# Patient Record
Sex: Female | Born: 1950 | Race: Black or African American | Hispanic: No | Marital: Single | Smoking: Current every day smoker
Health system: Southern US, Community
[De-identification: ages and names within clinical notes are randomized; demographics above are authoritative.]

## PROBLEM LIST (undated history)

## (undated) DIAGNOSIS — J449 Chronic obstructive pulmonary disease, unspecified: Secondary | ICD-10-CM

## (undated) DIAGNOSIS — G4730 Sleep apnea, unspecified: Secondary | ICD-10-CM

## (undated) DIAGNOSIS — E079 Disorder of thyroid, unspecified: Secondary | ICD-10-CM

## (undated) DIAGNOSIS — T7840XA Allergy, unspecified, initial encounter: Secondary | ICD-10-CM

## (undated) DIAGNOSIS — K219 Gastro-esophageal reflux disease without esophagitis: Secondary | ICD-10-CM

## (undated) DIAGNOSIS — K469 Unspecified abdominal hernia without obstruction or gangrene: Secondary | ICD-10-CM

## (undated) DIAGNOSIS — I10 Essential (primary) hypertension: Secondary | ICD-10-CM

## (undated) DIAGNOSIS — IMO0002 Reserved for concepts with insufficient information to code with codable children: Secondary | ICD-10-CM

## (undated) DIAGNOSIS — M329 Systemic lupus erythematosus, unspecified: Secondary | ICD-10-CM

## (undated) HISTORY — DX: Allergy, unspecified, initial encounter: T78.40XA

## (undated) HISTORY — DX: Essential (primary) hypertension: I10

## (undated) HISTORY — DX: Sleep apnea, unspecified: G47.30

## (undated) HISTORY — PX: TUBAL LIGATION: SHX77

## (undated) HISTORY — PX: CHOLECYSTECTOMY: SHX55

## (undated) HISTORY — PX: ABDOMINAL HYSTERECTOMY: SHX81

## (undated) HISTORY — DX: Gastro-esophageal reflux disease without esophagitis: K21.9

## (undated) HISTORY — DX: Chronic obstructive pulmonary disease, unspecified: J44.9

## (undated) HISTORY — DX: Disorder of thyroid, unspecified: E07.9

---

## 2013-08-05 ENCOUNTER — Ambulatory Visit (INDEPENDENT_AMBULATORY_CARE_PROVIDER_SITE_OTHER): Payer: No Typology Code available for payment source | Admitting: Family Medicine

## 2013-08-05 ENCOUNTER — Encounter: Payer: Self-pay | Admitting: Family Medicine

## 2013-08-05 VITALS — BP 191/68 | HR 78 | Temp 98.2°F | Ht 65.5 in | Wt 140.0 lb

## 2013-08-05 DIAGNOSIS — Z87898 Personal history of other specified conditions: Secondary | ICD-10-CM

## 2013-08-05 DIAGNOSIS — Z8669 Personal history of other diseases of the nervous system and sense organs: Secondary | ICD-10-CM | POA: Insufficient documentation

## 2013-08-05 DIAGNOSIS — E039 Hypothyroidism, unspecified: Secondary | ICD-10-CM | POA: Insufficient documentation

## 2013-08-05 DIAGNOSIS — J309 Allergic rhinitis, unspecified: Secondary | ICD-10-CM

## 2013-08-05 DIAGNOSIS — J449 Chronic obstructive pulmonary disease, unspecified: Secondary | ICD-10-CM

## 2013-08-05 DIAGNOSIS — IMO0002 Reserved for concepts with insufficient information to code with codable children: Secondary | ICD-10-CM

## 2013-08-05 DIAGNOSIS — Z0000 Encounter for general adult medical examination without abnormal findings: Secondary | ICD-10-CM | POA: Insufficient documentation

## 2013-08-05 DIAGNOSIS — M329 Systemic lupus erythematosus, unspecified: Secondary | ICD-10-CM

## 2013-08-05 DIAGNOSIS — K219 Gastro-esophageal reflux disease without esophagitis: Secondary | ICD-10-CM | POA: Insufficient documentation

## 2013-08-05 DIAGNOSIS — I10 Essential (primary) hypertension: Secondary | ICD-10-CM | POA: Insufficient documentation

## 2013-08-05 DIAGNOSIS — J302 Other seasonal allergic rhinitis: Secondary | ICD-10-CM | POA: Insufficient documentation

## 2013-08-05 MED ORDER — RAMIPRIL 10 MG PO CAPS: 10.0000 mg | ORAL_CAPSULE | Freq: Every day | ORAL | Status: DC

## 2013-08-05 MED ORDER — HYDROCHLOROTHIAZIDE 12.5 MG PO CAPS: 12.5000 mg | ORAL_CAPSULE | Freq: Every day | ORAL | Status: DC

## 2013-08-05 MED ORDER — ATENOLOL 50 MG PO TABS: 50.0000 mg | ORAL_TABLET | Freq: Every day | ORAL | Status: DC

## 2013-08-05 MED ORDER — LEVOTHYROXINE SODIUM 100 MCG PO TABS: 100.0000 ug | ORAL_TABLET | Freq: Every day | ORAL | Status: AC

## 2013-08-05 NOTE — Assessment & Plan Note (Signed)
Currently reflux is under control. Patient has taken a PPI in the past.

## 2013-08-05 NOTE — Patient Instructions (Signed)
Keeping You Healthy  Get These Tests  Blood Pressure- Have your blood pressure checked by your healthcare provider at least once a year.  Normal blood pressure is 120/80.  Weight- Have your body mass index (BMI) calculated to screen for obesity.  BMI is a measure of body fat based on height and weight.  You can calculate your own BMI at www.nhlbisupport.com/bmi/  Cholesterol- Have your cholesterol checked every year.  Diabetes- Have your blood sugar checked every year if you have high blood pressure, high cholesterol, a family history of diabetes or if you are overweight.  Pap Smear- Have a pap smear every 1 to 3 years if you have been sexually active.  If you are older than 65 and recent pap smears have been normal you may not need additional pap smears.  In addition, if you have had a hysterectomy  For benign disease additional pap smears are not necessary.  Mammogram-Yearly mammograms are essential for early detection of breast cancer  Screening for Colon Cancer- Colonoscopy starting at age 50. Screening may begin sooner depending on your family history and other health conditions.  Follow up colonoscopy as directed by your Gastroenterologist.  Screening for Osteoporosis- Screening begins at age 65 with bone density scanning, sooner if you are at higher risk for developing Osteoporosis.  Get these medicines  Calcium with Vitamin D- Your body requires 1200-1500 mg of Calcium a day and 800-1000 IU of Vitamin D a day.  You can only absorb 500 mg of Calcium at a time therefore Calcium must be taken in 2 or 3 separate doses throughout the day.  Hormones- Hormone therapy has been associated with increased risk for certain cancers and heart disease.  Talk to your healthcare provider about if you need relief from menopausal symptoms.  Aspirin- Ask your healthcare provider about taking Aspirin to prevent Heart Disease and Stroke.  Get these Immuniztions  Flu shot- Every fall  Pneumonia  shot- Once after the age of 65; if you are younger ask your healthcare provider if you need a pneumonia shot.  Tetanus- Every ten years.  Zostavax- Once after the age of 60 to prevent shingles.  Take these steps  Don't smoke- Your healthcare provider can help you quit. For tips on how to quit, ask your healthcare provider or go to www.smokefree.gov or call 1-800 QUIT-NOW.  Be physically active- Exercise 5 days a week for a minimum of 30 minutes.  If you are not already physically active, start slow and gradually work up to 30 minutes of moderate physical activity.  Try walking, dancing, bike riding, swimming, etc.  Eat a healthy diet- Eat a variety of healthy foods such as fruits, vegetables, whole grains, low fat milk, low fat cheeses, yogurt, lean meats, chicken, fish, eggs, dried beans, tofu, etc.  For more information go to www.thenutritionsource.org  Dental visit- Brush and floss teeth twice daily; visit your dentist twice a year.  Eye exam- Visit your Optometrist or Ophthalmologist yearly.  Drink alcohol in moderation- Limit alcohol intake to one drink or less a day.  Never drink and drive.  Depression- Your emotional health is as important as your physical health.  If you're feeling down or losing interest in things you normally enjoy, please talk to your healthcare provider.  Seat Belts- can save your life; always wear one  Smoke/Carbon Monoxide detectors- These detectors need to be installed on the appropriate level of your home.  Replace batteries at least once a year.  Violence- If anyone   is threatening or hurting you, please tell your healthcare provider.  Living Will/ Health care power of attorney- Discuss with your healthcare provider and family.  It was a pleasure meeting you today. I have called in your prescriptions that he is taking chronically. I will place a referral for rheumatology and sleep clinic. We will obtain labs today and I'll call you after I get all the  results. I would like to see you in one month to discuss your blood pressure. These do not take any over-the-counter sinus/congestion medications with your high blood pressure. Please ask your pharmacist if you have any questions about medications are safe to take with her elevated blood pressure. I recommend you have a mammogram this year.

## 2013-08-05 NOTE — Assessment & Plan Note (Signed)
Allegra when necessary

## 2013-08-05 NOTE — Assessment & Plan Note (Addendum)
Currently patient's hypertension is not controlled. She states that she took an over-the-counter sinus medication that is driving up her blood pressure. Urged patient not to take sinus medication other than not approved by her pharmacist for her congestion. Need to followup with the patient in 2 weeks to a month for her blood pressure. Patient states that she probably won't be available to August. I have encouraged her to take her blood pressure at the drugstore and call me if she has pressures elevated above 140/90 Refill HCTZ, atenolol and ramipril

## 2013-08-05 NOTE — Assessment & Plan Note (Signed)
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 Currently she's not on pain medications for these diseases. Patient brought a packet of records with her that I will review and update diagnosis is as needed

## 2013-08-05 NOTE — Assessment & Plan Note (Signed)
Patient with a history of COPD. Inhalers or not on her medication list but she states that she has a rescue inhaler. Pulmonary function tests may be needed in the future.

## 2013-08-05 NOTE — Assessment & Plan Note (Signed)
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 She is asking for referral to a rheumatologist which I will place today.

## 2013-08-05 NOTE — Assessment & Plan Note (Signed)
A. she has history of migraines. She is unable to remember any type of medications that she had used.

## 2013-08-05 NOTE — Assessment & Plan Note (Signed)
Patient states she has a history of sleep apnea but was never able to get her CPAP machine because the company closed down in [place]. Will send her for sleep studies.

## 2013-08-05 NOTE — Assessment & Plan Note (Signed)
Patient on Synthroid currently. Last TSH was in January was normal. Refills today prescribed.

## 2013-08-05 NOTE — Assessment & Plan Note (Signed)
Patient will be due for a colonoscopy in 2016 due to history of polyps. Patient declines mammogram this year. Patient will need flu shot this winter. Tetanus is up-to-date. Pap is up-to-date. I will need to review her charts that she brought with her today to see about further immunizations.

## 2013-08-05 NOTE — Progress Notes (Signed)
   Subjective:    Patient ID: Sydney Dibblesiane Moon, female    DOB: 12-15-1950, 63 y.o.   MRN: [id number]  HPI Sydney Moon is a 63 y.o. African American female presented to family medicine clinic for new establishment of care. Patient has a past medical history of lupus, seasonal allergies, chronic pain in back and left knee ("left knee no cartilage, L4-L5 no cartilage), COPD, acid reflux, high blood pressure, migraines, sleep apnea, ventral hernia and thyroid disease.   Patient has questionable history of a positive heart stress test in 2012. She states it needed to be stopped because of elevated blood pressure. She states she had an echocardiogram in 2012 that was good. She brings a packet of records with her today that we'll need to be reviewed. Recent labs from January are also included in packet.  Surgical history: Patient had gallbladder removal in 1996, tubal ligation 1982, hysterectomy and left nephrectomy in 1986.  Current medications: HCTZ 12.5 mg daily, Synthroid 100 mg daily, atenolol 50 mg daily, Atenolol  10 mg daily, Allegra when necessary  Allergies: Fresh clams, sulfa and prednisone.  Family history:  Mother had a history of pancreatic cancer, diabetes, high blood pressure Father had esophageal cancer, heart attack, diabetes. Brother had alcohol or drug abuse and died of an early death due to his abuse. Grandparents history is positive for heart disease and thyroid disease. Aunt with throat cancer diabetes high blood pressure and stroke.  Social history: Patient is a single female, currently not in a relationship. Never married. Prefers female partners. [place] within the last 6 months to be close to her daughter and 2 grandchildren. [place]. Retired Forensic scientistD technician. High school graduate. Smoker of 28 years. No alcohol or recreational drug use. Patient depends on public transportation, but she can drive. She has no religious beliefs that affect  her health care. He walks for exercise. She feels tired but states do to her lupus but otherwise is negative depression screening.  Health maintenance: Patient's last colonoscopy was in 2013 was positive for polyps, benign. Repeat colonoscopy due in 2016. Patient's mammogram was in 2013 was negative. Patient's Pap test was completed in 2013 is negative. Patient states she she has no abnormal Paps in the past. Patient had bone density test in 2013 and was in normal range. Patient's last tetanus shot was in 2014.  Review of Systems Pertinent review of systems Negative, with the exception of above mentioned in HPI     Objective:   Physical Exam BP 191/68  Pulse 78  Temp(Src) 98.2 F (36.8 C) (Oral)  Ht 5' 5.5" (1.664 m)  Wt 140 lb (63.504 kg)  BMI 22.93 kg/m2 Gen: Pleasant, African American female. Thin. No acute distress, nontoxic in appearance HEENT: AT. [place]. Bilateral TM visualized and normal in appearance. Bilateral eyes without injections or icterus. MMM. Bilateral nares without erythema or swelling. Throat without erythema or exudates.  CV: RRR, no murmurs clicks gallops or rubs appreciated Chest: CTAB, no wheeze or crackles Abd: Soft. Thin. NTND. BS positive. No Masses palpated.  Ext: No erythema. No edema.  Skin: No rashes, purpura or petechiae.  Neuro:  Normal gait. PERLA. EOMi. Alert. Cranial nerves II through XII intact. No focal deficits. Muscle strength 5 out of 5 bilaterally upper and lower extremity. Psych: Normal affect, dressed, demeanor and speech.

## 2013-08-29 ENCOUNTER — Encounter: Payer: Self-pay | Admitting: Family Medicine

## 2013-08-29 ENCOUNTER — Telehealth: Payer: Self-pay | Admitting: Family Medicine

## 2013-08-29 DIAGNOSIS — M3500 Sicca syndrome, unspecified: Secondary | ICD-10-CM | POA: Insufficient documentation

## 2013-08-29 NOTE — Telephone Encounter (Signed)
Attempted to call patient to inform her and Voice mailbox not set up

## 2013-08-29 NOTE — Telephone Encounter (Signed)
Please call Pt and inform her that I have made copies of pertinent medical records and uploaded them to her file. I have placed her original files behind the front desk with her name for her to pick up at her convenience. Thanks.

## 2013-09-17 ENCOUNTER — Encounter: Payer: Self-pay | Admitting: Family Medicine

## 2013-09-17 ENCOUNTER — Ambulatory Visit (INDEPENDENT_AMBULATORY_CARE_PROVIDER_SITE_OTHER): Payer: No Typology Code available for payment source | Admitting: Family Medicine

## 2013-09-17 VITALS — BP 169/64 | HR 79 | Temp 97.9°F | Wt 140.2 lb

## 2013-09-17 DIAGNOSIS — M329 Systemic lupus erythematosus, unspecified: Secondary | ICD-10-CM

## 2013-09-17 DIAGNOSIS — Z0000 Encounter for general adult medical examination without abnormal findings: Secondary | ICD-10-CM

## 2013-09-17 DIAGNOSIS — I10 Essential (primary) hypertension: Secondary | ICD-10-CM

## 2013-09-17 DIAGNOSIS — G4733 Obstructive sleep apnea (adult) (pediatric): Secondary | ICD-10-CM | POA: Insufficient documentation

## 2013-09-17 DIAGNOSIS — Z23 Encounter for immunization: Secondary | ICD-10-CM

## 2013-09-17 NOTE — Patient Instructions (Signed)
Hypertension Hypertension, commonly called high blood pressure, is when the force of blood pumping through your arteries is too strong. Your arteries are the blood vessels that carry blood from your heart throughout your body. A blood pressure reading consists of a higher number over a lower number, such as 110/72. The higher number (systolic) is the pressure inside your arteries when your heart pumps. The lower number (diastolic) is the pressure inside your arteries when your heart relaxes. Ideally you want your blood pressure below 130/80. Hypertension forces your heart to work harder to pump blood. Your arteries may become narrow or stiff. Having hypertension puts you at risk for heart disease, stroke, and other problems.  RISK FACTORS Some risk factors for high blood pressure are controllable. Others are not.  Risk factors you cannot control include:   Race. You may be at higher risk if you are African American.  Age. Risk increases with age.  Gender. Men are at higher risk than women before age 63 years. After age 63, women are at higher risk than men. Risk factors you can control include:  Not getting enough exercise or physical activity.  Being overweight.  Getting too much fat, sugar, calories, or salt in your diet.  Drinking too much alcohol. SIGNS AND SYMPTOMS Hypertension does not usually cause signs or symptoms. Extremely high blood pressure (hypertensive crisis) may cause headache, anxiety, shortness of breath, and nosebleed. DIAGNOSIS  To check if you have hypertension, your health care provider will measure your blood pressure while you are seated, with your arm held at the level of your heart. It should be measured at least twice using the same arm. Certain conditions can cause a difference in blood pressure between your right and left arms. A blood pressure reading that is higher than normal on one occasion does not mean that you need treatment. If one blood pressure reading  is high, ask your health care provider about having it checked again. TREATMENT  Treating high blood pressure includes making lifestyle changes and possibly taking medicine. Living a healthy lifestyle can help lower high blood pressure. You may need to change some of your habits. Lifestyle changes may include:  Following the DASH diet. This diet is high in fruits, vegetables, and whole grains. It is low in salt, red meat, and added sugars.  Getting at least 2 hours of brisk physical activity every week.  Losing weight if necessary.  Not smoking.  Limiting alcoholic beverages.  Learning ways to reduce stress. If lifestyle changes are not enough to get your blood pressure under control, your health care provider may prescribe medicine. You may need to take more than one. Work closely with your health care provider to understand the risks and benefits. HOME CARE INSTRUCTIONS  Have your blood pressure rechecked as directed by your health care provider.   Take medicines only as directed by your health care provider. Follow the directions carefully. Blood pressure medicines must be taken as prescribed. The medicine does not work as well when you skip doses. Skipping doses also puts you at risk for problems.   Do not smoke.   Monitor your blood pressure at home as directed by your health care provider. SEEK MEDICAL CARE IF:   You think you are having a reaction to medicines taken.  You have recurrent headaches or feel dizzy.  You have swelling in your ankles.  You have trouble with your vision. SEEK IMMEDIATE MEDICAL CARE IF:  You develop a severe headache or confusion.  You have unusual weakness, numbness, or feel faint.  You have severe chest or abdominal pain.  You vomit repeatedly.  You have trouble breathing. MAKE SURE YOU:   Understand these instructions.  Will watch your condition.  Will get help right away if you are not doing well or get worse. Document  Released: 02/07/2005 Document Revised: 06/24/2013 Document Reviewed: 11/30/2012 [facility] Patient Information 2015 [employer] [place]. This information is not intended to replace advice given to you by your health care provider. Make sure you discuss any questions you have with your health care provider.  Low-Sodium Eating Plan Sodium raises blood pressure and causes water to be held in the body. Getting less sodium from food will help lower your blood pressure, reduce any swelling, and protect your heart, liver, and kidneys. We get sodium by adding salt (sodium chloride) to food. Most of our sodium comes from canned, boxed, and frozen foods. Restaurant foods, fast foods, and pizza are also very high in sodium. Even if you take medicine to lower your blood pressure or to reduce fluid in your body, getting less sodium from your food is important. WHAT IS MY PLAN? Most people should limit their sodium intake to 2,300 mg a day. Your health care provider recommends that you limit your sodium intake to __________ a day.  WHAT DO I NEED TO KNOW ABOUT THIS EATING PLAN? For the low-sodium eating plan, you will follow these general guidelines:  Choose foods with a % Daily Value for sodium of less than 5% (as listed on the food label).   Use salt-free seasonings or herbs instead of table salt or sea salt.   Check with your health care provider or pharmacist before using salt substitutes.   Eat fresh foods.  Eat more vegetables and fruits.  Limit canned vegetables. If you do use them, rinse them well to decrease the sodium.   Limit cheese to 1 oz (28 g) per day.   Eat lower-sodium products, often labeled as "lower sodium" or "no salt added."  Avoid foods that contain monosodium glutamate (MSG). MSG is sometimes added to [place] food and some canned foods.  Check food labels (Nutrition Facts labels) on foods to learn how much sodium is in one serving.  Eat more home-cooked food and less  restaurant, buffet, and fast food.  When eating at a restaurant, ask that your food be prepared with less salt or none, if possible.  HOW DO I READ FOOD LABELS FOR SODIUM INFORMATION? The Nutrition Facts label lists the amount of sodium in one serving of the food. If you eat more than one serving, you must multiply the listed amount of sodium by the number of servings. Food labels may also identify foods as:  Sodium free--Less than 5 mg in a serving.  Very low sodium--35 mg or less in a serving.  Low sodium--140 mg or less in a serving.  Light in sodium--50% less sodium in a serving. For example, if a food that usually has 300 mg of sodium is changed to become light in sodium, it will have 150 mg of sodium.  Reduced sodium--25% less sodium in a serving. For example, if a food that usually has 400 mg of sodium is changed to reduced sodium, it will have 300 mg of sodium. WHAT FOODS CAN I EAT? Grains Low-sodium cereals, including oats, puffed wheat and rice, and shredded wheat cereals. Low-sodium crackers. Unsalted rice and pasta. Lower-sodium bread.  Vegetables Frozen or fresh vegetables. Low-sodium or reduced-sodium canned vegetables.  Low-sodium or reduced-sodium tomato sauce and paste. Low-sodium or reduced-sodium tomato and vegetable juices.  Fruits Fresh, frozen, and canned fruit. Fruit juice.  Meat and Other Protein Products Low-sodium canned tuna and salmon. Fresh or frozen meat, poultry, seafood, and fish. Lamb. Unsalted nuts. Dried beans, peas, and lentils without added salt. Unsalted canned beans. Homemade soups without salt. Eggs.  Dairy Milk. Soy milk. Ricotta cheese. Low-sodium or reduced-sodium cheeses. Yogurt.  Condiments Fresh and dried herbs and spices. Salt-free seasonings. Onion and garlic powders. Low-sodium varieties of mustard and ketchup. Lemon juice.  Fats and Oils Reduced-sodium salad dressings. Unsalted butter.  Other Unsalted popcorn and pretzels.    The items listed above may not be a complete list of recommended foods or beverages. Contact your dietitian for more options. WHAT FOODS ARE NOT RECOMMENDED? Grains Instant hot cereals. Bread stuffing, pancake, and biscuit mixes. Croutons. Seasoned rice or pasta mixes. Noodle soup cups. Boxed or frozen macaroni and cheese. Self-rising flour. Regular salted crackers. Vegetables Regular canned vegetables. Regular canned tomato sauce and paste. Regular tomato and vegetable juices. Frozen vegetables in sauces. Salted french fries. Olives. Rosita Fire. Relishes. Sauerkraut. Salsa. Meat and Other Protein Products Salted, canned, smoked, spiced, or pickled meats, seafood, or fish. Bacon, ham, sausage, hot dogs, corned beef, chipped beef, and packaged luncheon meats. Salt pork. Jerky. Pickled herring. Anchovies, regular canned tuna, and sardines. Salted nuts. Dairy Processed cheese and cheese spreads. Cheese curds. Blue cheese and cottage cheese. Buttermilk.  Condiments Onion and garlic salt, seasoned salt, table salt, and sea salt. Canned and packaged gravies. Worcestershire sauce. Tartar sauce. Barbecue sauce. Teriyaki sauce. Soy sauce, including reduced sodium. Steak sauce. Fish sauce. Oyster sauce. Cocktail sauce. Horseradish. Regular ketchup and mustard. Meat flavorings and tenderizers. Bouillon cubes. Hot sauce. Tabasco sauce. Marinades. Taco seasonings. Relishes. Fats and Oils Regular salad dressings. Salted butter. Margarine. Ghee. Bacon fat.  Other Potato and tortilla chips. Corn chips and puffs. Salted popcorn and pretzels. Canned or dried soups. Pizza. Frozen entrees and pot pies.  The items listed above may not be a complete list of foods and beverages to avoid. Contact your dietitian for more information. Document Released: 07/30/2001 Document Revised: 02/12/2013 Document Reviewed: 12/12/2012 [facility] Patient Information 2015 [place], [place]. This information is not intended to replace  advice given to you by your health care provider. Make sure you discuss any questions you have with your health care provider.   Attempt to increase your exercise to 150 minutes week. Follow a low salt diet. Take your BP a few times a month and write it down.  F/U 6 months, unless you need [place] sooner or your BP is not responding to diet and exercise.

## 2013-09-17 NOTE — Assessment & Plan Note (Addendum)
Patient will be due for a colonoscopy in 2016 due to history of polyps, she will need to be reminded of this because she thinks she's good for 10 years. Patient declined a mammogram this year, however her last mammogram was in 2013. Patient's tetanus and Pap are up-to-date. Patient will need flu shot this winter. Patient received a pneumonia shot today. CBC-CMP,  TSH, ESR and lipids today.

## 2013-09-17 NOTE — Assessment & Plan Note (Signed)
Sleep study ordered today. We'll followup with patient once results of studies are available.

## 2013-09-17 NOTE — Progress Notes (Signed)
   Subjective:    Patient ID: [name], female    DOB: May 29, 1950, 63 y.o.   MRN: [id number]  HPI Sydney Moon is a 63 y.o. [facility]   Hypertension: Patient has a history of mildly elevated blood pressure on last visit. She is very resistant to changes in medications. She brings in her blood pressure log at home that has blood pressures ranging from 116-157/61-82. She states she's under more pressure at home with a stressful family situation. She reports she has not been eating healthy foods she normally does.  Sleep apnea: Patient states she has a history of snoring mildly and family members tell her if she stops breathing for a few seconds while sleeping. She endorses daytime fatigue and sleepiness. Patient was scheduled to have a sleep study prior to her leaving from [place], but never was able to get this completed.  Health maintenance: Pneumo shot due today. Patient is a current smoker.  Review of Systems Per hPI    Objective:   Physical Exam BP 169/64  Pulse 79  Temp(Src) 97.9 F (36.6 C) (Oral)  Wt 140 lb 3.2 oz (63.594 kg) Gen: Pleasant, African American female, no acute distress, nontoxic in appearance, well-developed, well-nourished. HEENT: AT. [place].Bilateral eyes without injections or icterus. MMM.  CV: RRR  Chest: CTAB, no wheeze or crackles Abd: Soft.  NTND. BS present. No Masses palpated.  Ext: No erythema. No edema.      Assessment & Plan:

## 2013-09-17 NOTE — Assessment & Plan Note (Signed)
Patient's blood pressure today is still mildly elevated. 169/60. She is resistant to any changes in medications. She declines any changes in medications. - Home blood pressures moderately elevated averaging 140s to 150s systolic/70s to 80s diastolic. - Patient encouraged to remove all added salt. Exercise 150 minutes a week. - Continue to take her blood pressures a few times a month. If she continues to have elevated blood pressures on this regimen she is to call back and make an appointment. - Repercussions of elevated blood pressures were discussed with her today. Patient is well educated on hypertension and its effects. Patient declines any medications changes at this time. - Followup in 6 months or sooner if needed.

## 2013-09-18 ENCOUNTER — Encounter: Payer: Self-pay | Admitting: Family Medicine

## 2013-09-18 LAB — CBC WITH DIFFERENTIAL/PLATELET
Basophils Absolute: 0 10*3/uL (ref 0.0–0.1)
Basophils Relative: 1 % (ref 0–1)
EOS ABS: 0 10*3/uL (ref 0.0–0.7)
Eosinophils Relative: 1 % (ref 0–5)
HCT: 38.1 % (ref 36.0–46.0)
HEMOGLOBIN: 12.5 g/dL (ref 12.0–15.0)
Lymphocytes Relative: 49 % — ABNORMAL HIGH (ref 12–46)
Lymphs Abs: 1.8 10*3/uL (ref 0.7–4.0)
MCH: 27.9 pg (ref 26.0–34.0)
MCHC: 32.8 g/dL (ref 30.0–36.0)
MCV: 85 fL (ref 78.0–100.0)
MONOS PCT: 11 % (ref 3–12)
Monocytes Absolute: 0.4 10*3/uL (ref 0.1–1.0)
NEUTROS ABS: 1.4 10*3/uL — AB (ref 1.7–7.7)
NEUTROS PCT: 38 % — AB (ref 43–77)
Platelets: 240 10*3/uL (ref 150–400)
RBC: 4.48 MIL/uL (ref 3.87–5.11)
RDW: 14 % (ref 11.5–15.5)
WBC: 3.6 10*3/uL — ABNORMAL LOW (ref 4.0–10.5)

## 2013-09-18 LAB — COMPLETE METABOLIC PANEL WITH GFR
ALBUMIN: 3.9 g/dL (ref 3.5–5.2)
ALT: 12 U/L (ref 0–35)
AST: 23 U/L (ref 0–37)
Alkaline Phosphatase: 64 U/L (ref 39–117)
BUN: 15 mg/dL (ref 6–23)
CO2: 33 meq/L — AB (ref 19–32)
Calcium: 9.8 mg/dL (ref 8.4–10.5)
Chloride: 101 mEq/L (ref 96–112)
Creat: 0.79 mg/dL (ref 0.50–1.10)
GFR, EST NON AFRICAN AMERICAN: 80 mL/min
GLUCOSE: 91 mg/dL (ref 70–99)
POTASSIUM: 3.6 meq/L (ref 3.5–5.3)
SODIUM: 142 meq/L (ref 135–145)
TOTAL PROTEIN: 7.7 g/dL (ref 6.0–8.3)
Total Bilirubin: 0.2 mg/dL (ref 0.2–1.2)

## 2013-09-18 LAB — TSH: TSH: 1.325 u[IU]/mL (ref 0.350–4.500)

## 2013-09-18 LAB — C-REACTIVE PROTEIN: CRP: <0.5 mg/dL (ref <0.60)

## 2013-09-18 LAB — LIPID PANEL
Cholesterol: 109 mg/dL (ref 0–200)
HDL: 35 mg/dL — AB (ref >39)
LDL CALC: 33 mg/dL (ref 0–99)
TRIGLYCERIDES: 206 mg/dL — AB (ref <150)
Total CHOL/HDL Ratio: 3.1 Ratio
VLDL: 41 mg/dL — AB (ref 0–40)

## 2013-09-18 LAB — SEDIMENTATION RATE: Sed Rate: 26 mm/hr — ABNORMAL HIGH (ref 0–22)

## 2015-04-15 ENCOUNTER — Other Ambulatory Visit: Payer: Self-pay | Admitting: *Deleted

## 2015-09-27 ENCOUNTER — Encounter (HOSPITAL_COMMUNITY): Payer: Self-pay | Admitting: Emergency Medicine

## 2015-09-27 ENCOUNTER — Emergency Department (HOSPITAL_BASED_OUTPATIENT_CLINIC_OR_DEPARTMENT_OTHER)
Admit: 2015-09-27 | Discharge: 2015-09-27 | Disposition: A | Discharge status: Home / Self Care | Payer: Self-pay | Attending: Emergency Medicine | Admitting: Emergency Medicine

## 2015-09-27 ENCOUNTER — Emergency Department (HOSPITAL_COMMUNITY): Payer: Self-pay

## 2015-09-27 ENCOUNTER — Emergency Department (HOSPITAL_COMMUNITY)
Admission: EM | Admit: 2015-09-27 | Discharge: 2015-09-27 | Disposition: A | Discharge status: Home / Self Care | Payer: Self-pay | Attending: Emergency Medicine | Admitting: Emergency Medicine

## 2015-09-27 DIAGNOSIS — J449 Chronic obstructive pulmonary disease, unspecified: Secondary | ICD-10-CM | POA: Insufficient documentation

## 2015-09-27 DIAGNOSIS — Z760 Encounter for issue of repeat prescription: Secondary | ICD-10-CM | POA: Insufficient documentation

## 2015-09-27 DIAGNOSIS — R0602 Shortness of breath: Secondary | ICD-10-CM | POA: Insufficient documentation

## 2015-09-27 DIAGNOSIS — F17200 Nicotine dependence, unspecified, uncomplicated: Secondary | ICD-10-CM | POA: Insufficient documentation

## 2015-09-27 DIAGNOSIS — E039 Hypothyroidism, unspecified: Secondary | ICD-10-CM | POA: Insufficient documentation

## 2015-09-27 DIAGNOSIS — M25562 Pain in left knee: Secondary | ICD-10-CM | POA: Insufficient documentation

## 2015-09-27 DIAGNOSIS — Z79899 Other long term (current) drug therapy: Secondary | ICD-10-CM | POA: Insufficient documentation

## 2015-09-27 DIAGNOSIS — M79609 Pain in unspecified limb: Secondary | ICD-10-CM

## 2015-09-27 DIAGNOSIS — I10 Essential (primary) hypertension: Secondary | ICD-10-CM | POA: Insufficient documentation

## 2015-09-27 HISTORY — DX: Unspecified abdominal hernia without obstruction or gangrene: K46.9

## 2015-09-27 HISTORY — DX: Systemic lupus erythematosus, unspecified: M32.9

## 2015-09-27 HISTORY — DX: Reserved for concepts with insufficient information to code with codable children: IMO0002

## 2015-09-27 LAB — BASIC METABOLIC PANEL
Anion gap: 8 (ref 5–15)
BUN: 6 mg/dL (ref 6–20)
CALCIUM: 9.3 mg/dL (ref 8.9–10.3)
CO2: 28 mmol/L (ref 22–32)
CREATININE: 0.68 mg/dL (ref 0.44–1.00)
Chloride: 103 mmol/L (ref 101–111)
Glucose, Bld: 103 mg/dL — ABNORMAL HIGH (ref 65–99)
Potassium: 3.5 mmol/L (ref 3.5–5.1)
SODIUM: 139 mmol/L (ref 135–145)

## 2015-09-27 LAB — CBC WITH DIFFERENTIAL/PLATELET
BASOS PCT: 1 %
Basophils Absolute: 0 10*3/uL (ref 0.0–0.1)
EOS ABS: 0 10*3/uL (ref 0.0–0.7)
Eosinophils Relative: 1 %
HCT: 40.9 % (ref 36.0–46.0)
Hemoglobin: 13.3 g/dL (ref 12.0–15.0)
LYMPHS ABS: 1.1 10*3/uL (ref 0.7–4.0)
Lymphocytes Relative: 29 %
MCH: 28.7 pg (ref 26.0–34.0)
MCHC: 32.5 g/dL (ref 30.0–36.0)
MCV: 88.1 fL (ref 78.0–100.0)
MONOS PCT: 8 %
Monocytes Absolute: 0.3 10*3/uL (ref 0.1–1.0)
NEUTROS ABS: 2.3 10*3/uL (ref 1.7–7.7)
NEUTROS PCT: 61 %
PLATELETS: 194 10*3/uL (ref 150–400)
RBC: 4.64 MIL/uL (ref 3.87–5.11)
RDW: 13.4 % (ref 11.5–15.5)
WBC: 3.7 10*3/uL — AB (ref 4.0–10.5)

## 2015-09-27 LAB — I-STAT TROPONIN, ED: TROPONIN I, POC: 0 ng/mL (ref 0.00–0.08)

## 2015-09-27 LAB — D-DIMER, QUANTITATIVE: D-Dimer, Quant: 1.11 ug/mL-FEU — ABNORMAL HIGH (ref 0.00–0.50)

## 2015-09-27 LAB — BRAIN NATRIURETIC PEPTIDE: B NATRIURETIC PEPTIDE 5: 34.8 pg/mL (ref 0.0–100.0)

## 2015-09-27 MED ORDER — ATENOLOL 50 MG PO TABS
50.0000 mg | ORAL_TABLET | Freq: Every day | ORAL | Status: DC
  Administered 2015-09-27: 50 mg via ORAL
  Filled 2015-09-27: qty 1

## 2015-09-27 MED ORDER — RAMIPRIL 10 MG PO CAPS
10.0000 mg | ORAL_CAPSULE | Freq: Every day | ORAL | Status: DC
  Administered 2015-09-27: 10 mg via ORAL
  Filled 2015-09-27: qty 1

## 2015-09-27 MED ORDER — ATENOLOL 50 MG PO TABS: 50.0000 mg | ORAL_TABLET | Freq: Every day | ORAL | 0 refills | Status: AC

## 2015-09-27 MED ORDER — HYDROCHLOROTHIAZIDE 12.5 MG PO CAPS: 12.5000 mg | ORAL_CAPSULE | Freq: Every day | ORAL | 0 refills | Status: DC

## 2015-09-27 MED ORDER — RAMIPRIL 10 MG PO CAPS: 10.0000 mg | ORAL_CAPSULE | Freq: Every day | ORAL | 0 refills | Status: DC

## 2015-09-27 MED ORDER — IOPAMIDOL (ISOVUE-370) INJECTION 76%
INTRAVENOUS | Status: AC
  Administered 2015-09-27: 100 mL
  Filled 2015-09-27: qty 100

## 2015-09-27 MED ORDER — HYDROCHLOROTHIAZIDE 12.5 MG PO CAPS
12.5000 mg | ORAL_CAPSULE | Freq: Every day | ORAL | Status: DC
  Administered 2015-09-27: 12.5 mg via ORAL
  Filled 2015-09-27: qty 1

## 2015-09-27 MED ORDER — ALBUTEROL SULFATE (2.5 MG/3ML) 0.083% IN NEBU
5.0000 mg | INHALATION_SOLUTION | Freq: Once | RESPIRATORY_TRACT | Status: AC
  Administered 2015-09-27: 5 mg via RESPIRATORY_TRACT
  Filled 2015-09-27: qty 6

## 2015-09-27 NOTE — ED Triage Notes (Signed)
Pt c/o swelling to back of knee since Friday. Pt reports that she is out of her BP medications since Monday. Pt reports travel by place recently. Pt reports had shortness of breath yesterday with heart palpitations.

## 2015-09-27 NOTE — ED Provider Notes (Signed)
[facility]-EMERGENCY DEPT Provider Note   CSN: [id number] Arrival date & time: 09/27/15  1237  First Provider Contact:  None       History   Chief Complaint Chief Complaint  Patient presents with  . Knee Pain  . Medication Refill  . Joint Swelling  . Shortness of Breath    HPI Sydney Moon is a 65 y.o. female.  HPI  The patient with history of lupus, COPD, hypertension presents for evaluation of seizures of breath. She reports chronic shortnessath, going on for months, but worsening over the past week or so and this is in relation to recent plane flight to [place] that she had. She denies any chest pain associated with this, and shortness of breath is exertional. She does not have any  shortness  of breath at rest. she also endorses some swelling behind her left knee for partly 2 days. It's tender, there is been no drainage, and she denies redness in the area. She denies any swelling on the front of her knee or inability to move the knee. She denies any chest pain, fevers, abdominal pain, nausea vomiting   Past Medical History:  Diagnosis Date  . Allergy   . COPD (chronic obstructive pulmonary disease) (HCC)   . GERD (gastroesophageal reflux disease)   . hernia   . Hypertension   . Lupus (HCC)   . Sleep apnea   . Thyroid disease     Patient Active Problem List   Diagnosis Date Noted  . Obstructive sleep apnea 09/17/2013  . H/O Sjogren's disease 08/29/2013  . Essential hypertension, benign 08/05/2013  . 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 Seasonal allergies 08/05/2013  . Degenerative disc disease 08/05/2013  . Chronic obstructive pulmonary disease (HCC) 08/05/2013  . GERD (gastroesophageal reflux disease) 08/05/2013  . History of sleep apnea 08/05/2013  . Unspecified hypothyroidism 08/05/2013  . History of migraine 08/05/2013  . Healthcare maintenance 08/05/2013    Past Surgical History:  Procedure Laterality Date  . ABDOMINAL HYSTERECTOMY    . CHOLECYSTECTOMY    .  TUBAL LIGATION      OB History    No data available       Home Medications    Prior to Admission medications   Medication Sig Start Date End Date Taking? Authorizing Provider  atenolol (TENORMIN) 50 MG tablet Take 1 tablet (50 mg total) by mouth daily. 08/05/13   Renee A Kuneff, DO  hydrochlorothiazide (MICROZIDE) 12.5 MG capsule Take 1 capsule (12.5 mg total) by mouth daily. 08/05/13   Renee A Kuneff, DO  levothyroxine (SYNTHROID, LEVOTHROID) 100 MCG tablet Take 1 tablet (100 mcg total) by mouth daily. 08/05/13   Renee A Kuneff, DO  ramipril (ALTACE) 10 MG capsule Take 1 capsule (10 mg total) by mouth daily. 08/05/13   Renee A Claiborne Billings, DO    Family History Family History  Problem Relation Age of Onset  . Pancreatic cancer Mother   . Diabetes Mother   . Hypertension Mother   . Esophageal cancer Father   . Heart disease Father   . Diabetes Father   . Drug abuse Brother   . Heart disease Maternal Grandmother   . Thyroid disease Maternal Grandmother     Social History Social History  Substance Use Topics  . Smoking status: Current Every Day Smoker  . Smokeless tobacco: Never Used  . Alcohol use Yes     Allergies   Clams [shellfish allergy]; Prednisone; and Sulfa antibiotics   Review of Systems Review  of Systems  Constitutional: Negative for chills and fever.  HENT: Negative for ear pain and sore throat.   Eyes: Negative for pain and visual disturbance.  Respiratory: Positive for shortness of breath. Negative for cough, chest tightness and wheezing.   Cardiovascular: Negative for chest pain and palpitations.  Gastrointestinal: Negative for abdominal pain and vomiting.  Genitourinary: Negative for dysuria and hematuria.  Musculoskeletal: Negative for arthralgias and back pain.  Skin: Negative for color change and rash.  Neurological: Negative for seizures and syncope.  All other systems reviewed and are negative.    Physical Exam Updated Vital Signs BP 179/93    Pulse 115   Temp 98.1 F (36.7 C) (Oral)   Resp 20   Ht  (1.651 m)   Wt 62.3 kg   SpO2 97%   BMI 22.84 kg/m   Physical Exam  Constitutional: She appears well-developed and well-nourished. No distress.  HENT:  Head: Normocephalic and atraumatic.  Eyes: Conjunctivae are normal.  Neck: Neck supple.  Cardiovascular: Normal rate and regular rhythm.   No murmur heard. Pulmonary/Chest: Effort normal and breath sounds normal. No respiratory distress.  Abdominal: Soft. There is no tenderness.  Musculoskeletal: She exhibits no edema.       Legs: R knee: Inspection: no deformity or effusion ROM: full, with pain Strength: 5/5 in flexion and 5/5 in extension Pulses: distal pulses intact Sensation: distal sensation intact   Neurological: She is alert.  Skin: Skin is warm and dry.  Psychiatric: She has a normal mood and affect.  Nursing note and vitals reviewed.    ED Treatments / Results  Labs (all labs ordered are listed, but only abnormal results are displayed) Labs Reviewed  D-DIMER, QUANTITATIVE (NOT AT [facility])  I-STAT TROPOININ, ED    EKG  EKG Interpretation None       Radiology No results found.  Procedures Procedures (including critical care time)  Medications Ordered in ED Medications  albuterol (PROVENTIL) (2.5 MG/3ML) 0.083% nebulizer solution 5 mg (not administered)     Initial Impression / Assessment and Plan / ED Course  I have reviewed the triage vital signs and the nursing notes.  Pertinent labs & imaging results that were available during my care of the patient were reviewed by me and considered in my medical decision making (see chart for details).  Clinical Course    Patient presents with multiple complaints, primarily is swelling on leg with SOB and recent travel.  She is concerned for a blood clot.  She appears comfortable and is HDS on exam.  She has no objective signs or symptoms of DVT with a low Wells score (of 0), and also low risk  Wells for Pe.  D-dimer ordered and was positive, so CTPE and LE DVT study ordered and pending at signout.  CXR ordered for acute on chronic SOB and showed emphysematous changes without infiltrate or effusion to suggest pneumonia.  No wheezing / cough / fever to suggest COPD exacerbation or pneumonia.  Regarding leg swelling, as noted above, there is no physical findings to suggest infection, no joint effusion either.  Feel it is mostly consistent with Baker's cyst.  HTN improved with home meds.  To do at signout: -followup on CTPE and DVT study - provide refill of home HTN medicines (atenolol, HCTZ, ramipril)   Patient signed out to Dr. Moody Bruins @ 1600.   Final Clinical Impressions(s) / ED Diagnoses   Final diagnoses:  None    New Prescriptions New Prescriptions  No medications on file     Marcelina MorelMichael Hazyl Marseille, MD 09/27/15 1600    Arby BarretteMarcy Pfeiffer, MD 09/30/15 [phone]

## 2015-09-27 NOTE — ED Provider Notes (Signed)
Care patient taken over from Dr. Darol DestineSupples at 1610. Patient with left lower extremity swelling behind left knee. D-dimer elevated at 1.11. Plan to obtain CT PE study, lower extremity Dopplers. If positive, treat. If negative, discharge home with refill of home blood pressure medications.  6:42 PM: CT PE study negative for pulmonary embolism, DVT study negative for DVT.  Patient reevaluated. Pain likely from Baker cyst. Encouraged NSAIDs at home.  Will fill patient's home blood pressure prescriptions.   Discussed with my attending, Dr. Preston FleetingGlick.   Dan HumphreysMichael Rotha Cassels, MD 09/27/15 1844    Dione Boozeavid Glick, MD 09/27/15 [phone]

## 2015-09-27 NOTE — Progress Notes (Signed)
VASCULAR LAB PRELIMINARY  PRELIMINARY  PRELIMINARY  PRELIMINARY  Bilateral lower extremity venous duplex completed.    Preliminary report:  There is no acute DVT or SVT noted in the bilateral lower extremities.   Sandeep Radell, RVT 09/27/2015, 5:01 PM

## 2015-09-27 NOTE — ED Notes (Signed)
Spoke to Vascular Tech and they are aware of patient consult

## 2015-10-23 DIAGNOSIS — M2550 Pain in unspecified joint: Secondary | ICD-10-CM | POA: Diagnosis not present

## 2015-10-23 DIAGNOSIS — E039 Hypothyroidism, unspecified: Secondary | ICD-10-CM | POA: Diagnosis not present

## 2015-10-23 DIAGNOSIS — Z23 Encounter for immunization: Secondary | ICD-10-CM | POA: Diagnosis not present

## 2015-10-23 DIAGNOSIS — J309 Allergic rhinitis, unspecified: Secondary | ICD-10-CM | POA: Diagnosis not present

## 2015-10-23 DIAGNOSIS — I10 Essential (primary) hypertension: Secondary | ICD-10-CM | POA: Diagnosis not present

## 2015-10-23 DIAGNOSIS — M329 Systemic lupus erythematosus, unspecified: Secondary | ICD-10-CM | POA: Diagnosis not present

## 2016-01-13 DIAGNOSIS — M5136 Other intervertebral disc degeneration, lumbar region: Secondary | ICD-10-CM | POA: Diagnosis not present

## 2016-01-13 DIAGNOSIS — R6889 Other general symptoms and signs: Secondary | ICD-10-CM | POA: Diagnosis not present

## 2016-01-13 DIAGNOSIS — M79642 Pain in left hand: Secondary | ICD-10-CM | POA: Diagnosis not present

## 2016-01-13 DIAGNOSIS — R21 Rash and other nonspecific skin eruption: Secondary | ICD-10-CM | POA: Diagnosis not present

## 2016-01-13 DIAGNOSIS — M329 Systemic lupus erythematosus, unspecified: Secondary | ICD-10-CM | POA: Diagnosis not present

## 2016-01-13 DIAGNOSIS — R5383 Other fatigue: Secondary | ICD-10-CM | POA: Diagnosis not present

## 2016-01-13 DIAGNOSIS — M064 Inflammatory polyarthropathy: Secondary | ICD-10-CM | POA: Diagnosis not present

## 2016-01-13 DIAGNOSIS — M79641 Pain in right hand: Secondary | ICD-10-CM | POA: Diagnosis not present

## 2016-01-13 DIAGNOSIS — M2550 Pain in unspecified joint: Secondary | ICD-10-CM | POA: Diagnosis not present

## 2016-01-18 DIAGNOSIS — E039 Hypothyroidism, unspecified: Secondary | ICD-10-CM | POA: Diagnosis not present

## 2016-01-18 DIAGNOSIS — I10 Essential (primary) hypertension: Secondary | ICD-10-CM | POA: Diagnosis not present

## 2016-01-25 DIAGNOSIS — Z23 Encounter for immunization: Secondary | ICD-10-CM | POA: Diagnosis not present

## 2016-03-16 DIAGNOSIS — M5136 Other intervertebral disc degeneration, lumbar region: Secondary | ICD-10-CM | POA: Diagnosis not present

## 2016-03-16 DIAGNOSIS — M2550 Pain in unspecified joint: Secondary | ICD-10-CM | POA: Diagnosis not present

## 2016-03-16 DIAGNOSIS — R21 Rash and other nonspecific skin eruption: Secondary | ICD-10-CM | POA: Diagnosis not present

## 2016-03-16 DIAGNOSIS — M329 Systemic lupus erythematosus, unspecified: Secondary | ICD-10-CM | POA: Diagnosis not present

## 2016-03-16 DIAGNOSIS — R6889 Other general symptoms and signs: Secondary | ICD-10-CM | POA: Diagnosis not present

## 2016-03-16 DIAGNOSIS — Z6822 Body mass index (BMI) 22.0-22.9, adult: Secondary | ICD-10-CM | POA: Diagnosis not present

## 2016-03-16 DIAGNOSIS — M0579 Rheumatoid arthritis with rheumatoid factor of multiple sites without organ or systems involvement: Secondary | ICD-10-CM | POA: Diagnosis not present

## 2016-04-19 DIAGNOSIS — Z0100 Encounter for examination of eyes and vision without abnormal findings: Secondary | ICD-10-CM | POA: Diagnosis not present

## 2016-04-20 DIAGNOSIS — H5213 Myopia, bilateral: Secondary | ICD-10-CM | POA: Diagnosis not present

## 2016-04-20 DIAGNOSIS — H524 Presbyopia: Secondary | ICD-10-CM | POA: Diagnosis not present

## 2016-04-20 DIAGNOSIS — H52223 Regular astigmatism, bilateral: Secondary | ICD-10-CM | POA: Diagnosis not present

## 2016-06-15 DIAGNOSIS — M0579 Rheumatoid arthritis with rheumatoid factor of multiple sites without organ or systems involvement: Secondary | ICD-10-CM | POA: Diagnosis not present

## 2016-06-15 DIAGNOSIS — M2550 Pain in unspecified joint: Secondary | ICD-10-CM | POA: Diagnosis not present

## 2016-06-15 DIAGNOSIS — M5136 Other intervertebral disc degeneration, lumbar region: Secondary | ICD-10-CM | POA: Diagnosis not present

## 2016-06-15 DIAGNOSIS — Z6822 Body mass index (BMI) 22.0-22.9, adult: Secondary | ICD-10-CM | POA: Diagnosis not present

## 2016-06-15 DIAGNOSIS — M329 Systemic lupus erythematosus, unspecified: Secondary | ICD-10-CM | POA: Diagnosis not present

## 2016-06-15 DIAGNOSIS — R21 Rash and other nonspecific skin eruption: Secondary | ICD-10-CM | POA: Diagnosis not present

## 2016-07-13 DIAGNOSIS — I10 Essential (primary) hypertension: Secondary | ICD-10-CM | POA: Diagnosis not present

## 2016-07-13 DIAGNOSIS — R6889 Other general symptoms and signs: Secondary | ICD-10-CM | POA: Diagnosis not present

## 2016-07-13 DIAGNOSIS — E064 Drug-induced thyroiditis: Secondary | ICD-10-CM | POA: Diagnosis not present

## 2016-07-13 DIAGNOSIS — F064 Anxiety disorder due to known physiological condition: Secondary | ICD-10-CM | POA: Diagnosis not present

## 2016-07-13 DIAGNOSIS — R7309 Other abnormal glucose: Secondary | ICD-10-CM | POA: Diagnosis not present

## 2016-07-13 DIAGNOSIS — E039 Hypothyroidism, unspecified: Secondary | ICD-10-CM | POA: Diagnosis not present

## 2016-07-13 DIAGNOSIS — M3210 Systemic lupus erythematosus, organ or system involvement unspecified: Secondary | ICD-10-CM | POA: Diagnosis not present

## 2016-08-16 DIAGNOSIS — R6889 Other general symptoms and signs: Secondary | ICD-10-CM | POA: Diagnosis not present

## 2016-08-17 DIAGNOSIS — Z1231 Encounter for screening mammogram for malignant neoplasm of breast: Secondary | ICD-10-CM | POA: Diagnosis not present

## 2016-08-17 DIAGNOSIS — Z780 Asymptomatic menopausal state: Secondary | ICD-10-CM | POA: Diagnosis not present

## 2016-08-17 DIAGNOSIS — R6889 Other general symptoms and signs: Secondary | ICD-10-CM | POA: Diagnosis not present

## 2016-08-17 DIAGNOSIS — M8589 Other specified disorders of bone density and structure, multiple sites: Secondary | ICD-10-CM | POA: Diagnosis not present

## 2016-09-14 DIAGNOSIS — F064 Anxiety disorder due to known physiological condition: Secondary | ICD-10-CM | POA: Diagnosis not present

## 2016-09-14 DIAGNOSIS — I10 Essential (primary) hypertension: Secondary | ICD-10-CM | POA: Diagnosis not present

## 2016-09-14 DIAGNOSIS — M3210 Systemic lupus erythematosus, organ or system involvement unspecified: Secondary | ICD-10-CM | POA: Diagnosis not present

## 2016-09-14 DIAGNOSIS — E039 Hypothyroidism, unspecified: Secondary | ICD-10-CM | POA: Diagnosis not present

## 2016-10-14 DIAGNOSIS — I10 Essential (primary) hypertension: Secondary | ICD-10-CM | POA: Diagnosis not present

## 2016-10-14 DIAGNOSIS — F064 Anxiety disorder due to known physiological condition: Secondary | ICD-10-CM | POA: Diagnosis not present

## 2016-10-14 DIAGNOSIS — E039 Hypothyroidism, unspecified: Secondary | ICD-10-CM | POA: Diagnosis not present

## 2016-10-14 DIAGNOSIS — M3210 Systemic lupus erythematosus, organ or system involvement unspecified: Secondary | ICD-10-CM | POA: Diagnosis not present

## 2016-10-19 DIAGNOSIS — M0579 Rheumatoid arthritis with rheumatoid factor of multiple sites without organ or systems involvement: Secondary | ICD-10-CM | POA: Diagnosis not present

## 2016-10-19 DIAGNOSIS — M5136 Other intervertebral disc degeneration, lumbar region: Secondary | ICD-10-CM | POA: Diagnosis not present

## 2016-10-19 DIAGNOSIS — Z79899 Other long term (current) drug therapy: Secondary | ICD-10-CM | POA: Diagnosis not present

## 2016-10-19 DIAGNOSIS — Z6822 Body mass index (BMI) 22.0-22.9, adult: Secondary | ICD-10-CM | POA: Diagnosis not present

## 2016-10-19 DIAGNOSIS — M2550 Pain in unspecified joint: Secondary | ICD-10-CM | POA: Diagnosis not present

## 2016-10-19 DIAGNOSIS — M329 Systemic lupus erythematosus, unspecified: Secondary | ICD-10-CM | POA: Diagnosis not present

## 2016-11-17 DIAGNOSIS — I10 Essential (primary) hypertension: Secondary | ICD-10-CM | POA: Diagnosis not present

## 2016-11-17 DIAGNOSIS — F064 Anxiety disorder due to known physiological condition: Secondary | ICD-10-CM | POA: Diagnosis not present

## 2016-11-17 DIAGNOSIS — E039 Hypothyroidism, unspecified: Secondary | ICD-10-CM | POA: Diagnosis not present

## 2016-11-17 DIAGNOSIS — M3210 Systemic lupus erythematosus, organ or system involvement unspecified: Secondary | ICD-10-CM | POA: Diagnosis not present

## 2016-11-18 DIAGNOSIS — M0579 Rheumatoid arthritis with rheumatoid factor of multiple sites without organ or systems involvement: Secondary | ICD-10-CM | POA: Diagnosis not present

## 2017-01-23 DIAGNOSIS — F064 Anxiety disorder due to known physiological condition: Secondary | ICD-10-CM | POA: Diagnosis not present

## 2017-01-23 DIAGNOSIS — M3210 Systemic lupus erythematosus, organ or system involvement unspecified: Secondary | ICD-10-CM | POA: Diagnosis not present

## 2017-01-23 DIAGNOSIS — I10 Essential (primary) hypertension: Secondary | ICD-10-CM | POA: Diagnosis not present

## 2018-01-10 ENCOUNTER — Other Ambulatory Visit: Payer: Self-pay | Admitting: Physician Assistant

## 2018-01-10 DIAGNOSIS — Z1231 Encounter for screening mammogram for malignant neoplasm of breast: Secondary | ICD-10-CM

## 2018-05-31 ENCOUNTER — Other Ambulatory Visit: Payer: Self-pay | Admitting: Physician Assistant

## 2018-05-31 DIAGNOSIS — R1084 Generalized abdominal pain: Secondary | ICD-10-CM

## 2018-07-13 ENCOUNTER — Ambulatory Visit
Admission: RE | Admit: 2018-07-13 | Discharge: 2018-07-13 | Disposition: A | Discharge status: Home / Self Care | Payer: Medicare HMO | Source: Ambulatory Visit | Attending: Physician Assistant | Admitting: Physician Assistant

## 2018-07-13 ENCOUNTER — Other Ambulatory Visit: Payer: Self-pay

## 2018-07-13 DIAGNOSIS — R1084 Generalized abdominal pain: Secondary | ICD-10-CM

## 2019-04-28 ENCOUNTER — Other Ambulatory Visit: Payer: Self-pay

## 2019-04-28 ENCOUNTER — Ambulatory Visit: Payer: Medicare HMO | Attending: Internal Medicine

## 2019-04-28 DIAGNOSIS — Z23 Encounter for immunization: Secondary | ICD-10-CM | POA: Insufficient documentation

## 2019-04-28 NOTE — Progress Notes (Signed)
   Covid-19 Vaccination Clinic  Name:  Sydney Moon    MRN: [id number] DOB: May 05, 1950  04/28/2019  [name] was observed post Covid-19 immunization for 15 minutes without incident. She was provided with Vaccine Information Sheet and instruction to access the V-Safe system.   [name] was instructed to call 911 with any severe reactions post vaccine: Marland Kitchen Difficulty breathing  . Swelling of face and throat  . A fast heartbeat  . A bad rash all over body  . Dizziness and weakness   Immunizations Administered    Name Date Dose VIS Date Route   Pfizer COVID-19 Vaccine 04/28/2019 10:59 AM 0.3 mL 02/01/2019 Intramuscular   Manufacturer: [employer], [employer]   Lot: [id number]   NDC: [address]-6

## 2019-05-28 ENCOUNTER — Ambulatory Visit: Payer: Medicare HMO | Attending: Internal Medicine

## 2019-05-28 DIAGNOSIS — Z23 Encounter for immunization: Secondary | ICD-10-CM

## 2019-05-28 NOTE — Progress Notes (Signed)
   Covid-19 Vaccination Clinic  Name:  Sydney Moon    MRN: [id number] DOB: April 20, 1950  05/28/2019  Sydney Moon was observed post Covid-19 immunization for 30 minutes based on pre-vaccination screening without incident. She was provided with Vaccine Information Sheet and instruction to access the V-Safe system.   Sydney Moon was instructed to call 911 with any severe reactions post vaccine: Marland Kitchen Difficulty breathing  . Swelling of face and throat  . A fast heartbeat  . A bad rash all over body  . Dizziness and weakness   Immunizations Administered    Name Date Dose VIS Date Route   Pfizer COVID-19 Vaccine 05/28/2019  8:50 AM 0.3 mL 02/01/2019 Intramuscular   Manufacturer: [employer], [employer]   Lot: [id number]   NDC: [address]-8

## 2020-05-12 DIAGNOSIS — E039 Hypothyroidism, unspecified: Secondary | ICD-10-CM | POA: Diagnosis not present

## 2020-05-12 DIAGNOSIS — E041 Nontoxic single thyroid nodule: Secondary | ICD-10-CM | POA: Diagnosis not present

## 2020-05-12 DIAGNOSIS — Z720 Tobacco use: Secondary | ICD-10-CM | POA: Diagnosis not present

## 2020-05-12 DIAGNOSIS — E890 Postprocedural hypothyroidism: Secondary | ICD-10-CM | POA: Diagnosis not present

## 2020-05-27 DIAGNOSIS — Z79899 Other long term (current) drug therapy: Secondary | ICD-10-CM | POA: Diagnosis not present

## 2020-05-27 DIAGNOSIS — M0579 Rheumatoid arthritis with rheumatoid factor of multiple sites without organ or systems involvement: Secondary | ICD-10-CM | POA: Diagnosis not present

## 2020-05-27 DIAGNOSIS — Z6824 Body mass index (BMI) 24.0-24.9, adult: Secondary | ICD-10-CM | POA: Diagnosis not present

## 2020-05-27 DIAGNOSIS — M5136 Other intervertebral disc degeneration, lumbar region: Secondary | ICD-10-CM | POA: Diagnosis not present

## 2020-05-27 DIAGNOSIS — M2550 Pain in unspecified joint: Secondary | ICD-10-CM | POA: Diagnosis not present

## 2020-05-27 DIAGNOSIS — R6889 Other general symptoms and signs: Secondary | ICD-10-CM | POA: Diagnosis not present

## 2020-05-27 DIAGNOSIS — M329 Systemic lupus erythematosus, unspecified: Secondary | ICD-10-CM | POA: Diagnosis not present

## 2020-05-27 DIAGNOSIS — R079 Chest pain, unspecified: Secondary | ICD-10-CM | POA: Diagnosis not present

## 2020-06-16 DIAGNOSIS — E1165 Type 2 diabetes mellitus with hyperglycemia: Secondary | ICD-10-CM | POA: Diagnosis not present

## 2020-06-16 DIAGNOSIS — I10 Essential (primary) hypertension: Secondary | ICD-10-CM | POA: Diagnosis not present

## 2020-06-17 DIAGNOSIS — E039 Hypothyroidism, unspecified: Secondary | ICD-10-CM | POA: Diagnosis not present

## 2020-06-17 DIAGNOSIS — E1165 Type 2 diabetes mellitus with hyperglycemia: Secondary | ICD-10-CM | POA: Diagnosis not present

## 2020-06-17 DIAGNOSIS — E1169 Type 2 diabetes mellitus with other specified complication: Secondary | ICD-10-CM | POA: Diagnosis not present

## 2020-06-17 DIAGNOSIS — J449 Chronic obstructive pulmonary disease, unspecified: Secondary | ICD-10-CM | POA: Diagnosis not present

## 2020-06-17 DIAGNOSIS — E782 Mixed hyperlipidemia: Secondary | ICD-10-CM | POA: Diagnosis not present

## 2020-06-17 DIAGNOSIS — I10 Essential (primary) hypertension: Secondary | ICD-10-CM | POA: Diagnosis not present

## 2020-06-17 DIAGNOSIS — I152 Hypertension secondary to endocrine disorders: Secondary | ICD-10-CM | POA: Diagnosis not present

## 2020-06-17 DIAGNOSIS — L4050 Arthropathic psoriasis, unspecified: Secondary | ICD-10-CM | POA: Diagnosis not present

## 2020-09-04 DIAGNOSIS — I10 Essential (primary) hypertension: Secondary | ICD-10-CM | POA: Diagnosis not present

## 2020-09-04 DIAGNOSIS — E039 Hypothyroidism, unspecified: Secondary | ICD-10-CM | POA: Diagnosis not present

## 2020-09-04 DIAGNOSIS — E1165 Type 2 diabetes mellitus with hyperglycemia: Secondary | ICD-10-CM | POA: Diagnosis not present

## 2020-10-21 DIAGNOSIS — K5900 Constipation, unspecified: Secondary | ICD-10-CM | POA: Diagnosis not present

## 2020-10-21 DIAGNOSIS — E782 Mixed hyperlipidemia: Secondary | ICD-10-CM | POA: Diagnosis not present

## 2020-10-21 DIAGNOSIS — R519 Headache, unspecified: Secondary | ICD-10-CM | POA: Diagnosis not present

## 2020-10-21 DIAGNOSIS — I10 Essential (primary) hypertension: Secondary | ICD-10-CM | POA: Diagnosis not present

## 2020-10-21 DIAGNOSIS — R002 Palpitations: Secondary | ICD-10-CM | POA: Diagnosis not present

## 2020-10-21 DIAGNOSIS — E1165 Type 2 diabetes mellitus with hyperglycemia: Secondary | ICD-10-CM | POA: Diagnosis not present

## 2020-10-21 DIAGNOSIS — R109 Unspecified abdominal pain: Secondary | ICD-10-CM | POA: Diagnosis not present

## 2020-10-21 DIAGNOSIS — J309 Allergic rhinitis, unspecified: Secondary | ICD-10-CM | POA: Diagnosis not present

## 2020-10-21 DIAGNOSIS — E039 Hypothyroidism, unspecified: Secondary | ICD-10-CM | POA: Diagnosis not present

## 2020-11-18 DIAGNOSIS — E890 Postprocedural hypothyroidism: Secondary | ICD-10-CM | POA: Diagnosis not present

## 2020-11-18 DIAGNOSIS — Z720 Tobacco use: Secondary | ICD-10-CM | POA: Diagnosis not present

## 2020-12-14 DIAGNOSIS — E039 Hypothyroidism, unspecified: Secondary | ICD-10-CM | POA: Diagnosis not present

## 2020-12-14 DIAGNOSIS — R519 Headache, unspecified: Secondary | ICD-10-CM | POA: Diagnosis not present

## 2020-12-14 DIAGNOSIS — I10 Essential (primary) hypertension: Secondary | ICD-10-CM | POA: Diagnosis not present

## 2020-12-14 DIAGNOSIS — K5900 Constipation, unspecified: Secondary | ICD-10-CM | POA: Diagnosis not present

## 2020-12-14 DIAGNOSIS — J309 Allergic rhinitis, unspecified: Secondary | ICD-10-CM | POA: Diagnosis not present

## 2020-12-16 DIAGNOSIS — Z6825 Body mass index (BMI) 25.0-25.9, adult: Secondary | ICD-10-CM | POA: Diagnosis not present

## 2020-12-16 DIAGNOSIS — E663 Overweight: Secondary | ICD-10-CM | POA: Diagnosis not present

## 2020-12-16 DIAGNOSIS — M5136 Other intervertebral disc degeneration, lumbar region: Secondary | ICD-10-CM | POA: Diagnosis not present

## 2020-12-16 DIAGNOSIS — M0579 Rheumatoid arthritis with rheumatoid factor of multiple sites without organ or systems involvement: Secondary | ICD-10-CM | POA: Diagnosis not present

## 2020-12-16 DIAGNOSIS — M329 Systemic lupus erythematosus, unspecified: Secondary | ICD-10-CM | POA: Diagnosis not present

## 2020-12-16 DIAGNOSIS — M2550 Pain in unspecified joint: Secondary | ICD-10-CM | POA: Diagnosis not present

## 2020-12-16 DIAGNOSIS — Z79899 Other long term (current) drug therapy: Secondary | ICD-10-CM | POA: Diagnosis not present

## 2020-12-23 ENCOUNTER — Other Ambulatory Visit: Payer: Self-pay | Admitting: Family Medicine

## 2020-12-23 DIAGNOSIS — E2839 Other primary ovarian failure: Secondary | ICD-10-CM

## 2020-12-30 ENCOUNTER — Other Ambulatory Visit: Payer: Self-pay | Admitting: Family Medicine

## 2020-12-30 DIAGNOSIS — R519 Headache, unspecified: Secondary | ICD-10-CM

## 2021-02-02 DIAGNOSIS — R946 Abnormal results of thyroid function studies: Secondary | ICD-10-CM | POA: Diagnosis not present

## 2021-02-02 DIAGNOSIS — E782 Mixed hyperlipidemia: Secondary | ICD-10-CM | POA: Diagnosis not present

## 2021-02-02 DIAGNOSIS — I10 Essential (primary) hypertension: Secondary | ICD-10-CM | POA: Diagnosis not present

## 2021-02-04 DIAGNOSIS — F411 Generalized anxiety disorder: Secondary | ICD-10-CM | POA: Diagnosis not present

## 2021-02-04 DIAGNOSIS — R519 Headache, unspecified: Secondary | ICD-10-CM | POA: Diagnosis not present

## 2021-02-04 DIAGNOSIS — F331 Major depressive disorder, recurrent, moderate: Secondary | ICD-10-CM | POA: Diagnosis not present

## 2021-02-04 DIAGNOSIS — I10 Essential (primary) hypertension: Secondary | ICD-10-CM | POA: Diagnosis not present

## 2021-02-04 DIAGNOSIS — E039 Hypothyroidism, unspecified: Secondary | ICD-10-CM | POA: Diagnosis not present

## 2021-02-04 DIAGNOSIS — G4700 Insomnia, unspecified: Secondary | ICD-10-CM | POA: Diagnosis not present

## 2021-02-10 ENCOUNTER — Other Ambulatory Visit: Payer: Self-pay | Admitting: Family Medicine

## 2021-02-10 DIAGNOSIS — Z23 Encounter for immunization: Secondary | ICD-10-CM | POA: Diagnosis not present

## 2021-02-10 DIAGNOSIS — Z720 Tobacco use: Secondary | ICD-10-CM

## 2021-02-10 DIAGNOSIS — Z1331 Encounter for screening for depression: Secondary | ICD-10-CM | POA: Diagnosis not present

## 2021-02-10 DIAGNOSIS — Z0000 Encounter for general adult medical examination without abnormal findings: Secondary | ICD-10-CM | POA: Diagnosis not present

## 2021-02-10 DIAGNOSIS — I10 Essential (primary) hypertension: Secondary | ICD-10-CM | POA: Diagnosis not present

## 2021-02-10 DIAGNOSIS — F411 Generalized anxiety disorder: Secondary | ICD-10-CM | POA: Diagnosis not present

## 2021-02-10 DIAGNOSIS — F331 Major depressive disorder, recurrent, moderate: Secondary | ICD-10-CM | POA: Diagnosis not present

## 2021-02-10 DIAGNOSIS — Z1339 Encounter for screening examination for other mental health and behavioral disorders: Secondary | ICD-10-CM | POA: Diagnosis not present

## 2021-02-10 DIAGNOSIS — G4700 Insomnia, unspecified: Secondary | ICD-10-CM | POA: Diagnosis not present

## 2021-02-11 DIAGNOSIS — Z79899 Other long term (current) drug therapy: Secondary | ICD-10-CM | POA: Diagnosis not present

## 2021-02-11 DIAGNOSIS — E119 Type 2 diabetes mellitus without complications: Secondary | ICD-10-CM | POA: Diagnosis not present

## 2021-02-11 DIAGNOSIS — H40023 Open angle with borderline findings, high risk, bilateral: Secondary | ICD-10-CM | POA: Diagnosis not present

## 2021-02-11 DIAGNOSIS — R6889 Other general symptoms and signs: Secondary | ICD-10-CM | POA: Diagnosis not present

## 2021-02-11 DIAGNOSIS — H25813 Combined forms of age-related cataract, bilateral: Secondary | ICD-10-CM | POA: Diagnosis not present

## 2021-02-16 ENCOUNTER — Other Ambulatory Visit: Payer: Self-pay

## 2021-02-16 ENCOUNTER — Ambulatory Visit
Admission: RE | Admit: 2021-02-16 | Discharge: 2021-02-16 | Disposition: A | Discharge status: Home / Self Care | Payer: Medicare HMO | Source: Ambulatory Visit | Attending: Family Medicine | Admitting: Family Medicine

## 2021-02-16 DIAGNOSIS — R519 Headache, unspecified: Secondary | ICD-10-CM | POA: Diagnosis not present

## 2021-03-09 ENCOUNTER — Ambulatory Visit: Payer: Medicare HMO

## 2021-03-16 ENCOUNTER — Ambulatory Visit
Admission: RE | Admit: 2021-03-16 | Discharge: 2021-03-16 | Disposition: A | Discharge status: Home / Self Care | Payer: Medicare HMO | Source: Ambulatory Visit | Attending: Family Medicine | Admitting: Family Medicine

## 2021-03-16 DIAGNOSIS — Z720 Tobacco use: Secondary | ICD-10-CM

## 2021-03-16 DIAGNOSIS — R6889 Other general symptoms and signs: Secondary | ICD-10-CM | POA: Diagnosis not present

## 2021-03-19 DIAGNOSIS — M85852 Other specified disorders of bone density and structure, left thigh: Secondary | ICD-10-CM | POA: Diagnosis not present

## 2021-03-19 DIAGNOSIS — Z1231 Encounter for screening mammogram for malignant neoplasm of breast: Secondary | ICD-10-CM | POA: Diagnosis not present

## 2021-03-23 DIAGNOSIS — F411 Generalized anxiety disorder: Secondary | ICD-10-CM | POA: Diagnosis not present

## 2021-03-23 DIAGNOSIS — F331 Major depressive disorder, recurrent, moderate: Secondary | ICD-10-CM | POA: Diagnosis not present

## 2021-03-23 DIAGNOSIS — G4700 Insomnia, unspecified: Secondary | ICD-10-CM | POA: Diagnosis not present

## 2021-03-23 DIAGNOSIS — I10 Essential (primary) hypertension: Secondary | ICD-10-CM | POA: Diagnosis not present

## 2021-03-23 DIAGNOSIS — Z720 Tobacco use: Secondary | ICD-10-CM | POA: Diagnosis not present

## 2021-06-29 LAB — COLOGUARD: COLOGUARD: NEGATIVE

## 2022-02-26 ENCOUNTER — Emergency Department (HOSPITAL_BASED_OUTPATIENT_CLINIC_OR_DEPARTMENT_OTHER): Payer: Medicare HMO

## 2022-02-26 ENCOUNTER — Encounter (HOSPITAL_BASED_OUTPATIENT_CLINIC_OR_DEPARTMENT_OTHER): Payer: Self-pay

## 2022-02-26 ENCOUNTER — Emergency Department (HOSPITAL_BASED_OUTPATIENT_CLINIC_OR_DEPARTMENT_OTHER)
Admission: EM | Admit: 2022-02-26 | Discharge: 2022-02-26 | Disposition: A | Discharge status: Home / Self Care | Payer: Medicare HMO | Attending: Emergency Medicine | Admitting: Emergency Medicine

## 2022-02-26 ENCOUNTER — Other Ambulatory Visit: Payer: Self-pay

## 2022-02-26 DIAGNOSIS — R519 Headache, unspecified: Secondary | ICD-10-CM | POA: Diagnosis not present

## 2022-02-26 DIAGNOSIS — Z1152 Encounter for screening for COVID-19: Secondary | ICD-10-CM | POA: Diagnosis not present

## 2022-02-26 DIAGNOSIS — M329 Systemic lupus erythematosus, unspecified: Secondary | ICD-10-CM | POA: Insufficient documentation

## 2022-02-26 DIAGNOSIS — E039 Hypothyroidism, unspecified: Secondary | ICD-10-CM | POA: Diagnosis not present

## 2022-02-26 DIAGNOSIS — I10 Essential (primary) hypertension: Secondary | ICD-10-CM | POA: Diagnosis not present

## 2022-02-26 DIAGNOSIS — J449 Chronic obstructive pulmonary disease, unspecified: Secondary | ICD-10-CM | POA: Diagnosis not present

## 2022-02-26 DIAGNOSIS — H532 Diplopia: Secondary | ICD-10-CM | POA: Insufficient documentation

## 2022-02-26 LAB — CBC
HCT: 39.9 % (ref 36.0–46.0)
Hemoglobin: 12.8 g/dL (ref 12.0–15.0)
MCH: 28.2 pg (ref 26.0–34.0)
MCHC: 32.1 g/dL (ref 30.0–36.0)
MCV: 87.9 fL (ref 80.0–100.0)
Platelets: 199 10*3/uL (ref 150–400)
RBC: 4.54 MIL/uL (ref 3.87–5.11)
RDW: 13.3 % (ref 11.5–15.5)
WBC: 3.4 10*3/uL — ABNORMAL LOW (ref 4.0–10.5)
nRBC: 0 % (ref 0.0–0.2)

## 2022-02-26 LAB — BASIC METABOLIC PANEL
Anion gap: 8 (ref 5–15)
BUN: 15 mg/dL (ref 8–23)
CO2: 31 mmol/L (ref 22–32)
Calcium: 10 mg/dL (ref 8.9–10.3)
Chloride: 99 mmol/L (ref 98–111)
Creatinine, Ser: 0.8 mg/dL (ref 0.44–1.00)
GFR, Estimated: >60 mL/min (ref >60)
Glucose, Bld: 85 mg/dL (ref 70–99)
Potassium: 3.4 mmol/L — ABNORMAL LOW (ref 3.5–5.1)
Sodium: 138 mmol/L (ref 135–145)

## 2022-02-26 LAB — RESP PANEL BY RT-PCR (RSV, FLU A&B, COVID)  RVPGX2
Influenza A by PCR: NEGATIVE
Influenza B by PCR: NEGATIVE
Resp Syncytial Virus by PCR: NEGATIVE
SARS Coronavirus 2 by RT PCR: NEGATIVE

## 2022-02-26 LAB — TSH: TSH: 14.236 u[IU]/mL — ABNORMAL HIGH (ref 0.350–4.500)

## 2022-02-26 LAB — T4, FREE: Free T4: 0.92 ng/dL (ref 0.61–1.12)

## 2022-02-26 MED ORDER — METOCLOPRAMIDE HCL 5 MG/ML IJ SOLN
10.0000 mg | Freq: Once | INTRAMUSCULAR | Status: AC
  Administered 2022-02-26: 10 mg via INTRAVENOUS
  Filled 2022-02-26: qty 2

## 2022-02-26 MED ORDER — SODIUM CHLORIDE 0.9 % IV BOLUS
1000.0000 mL | Freq: Once | INTRAVENOUS | Status: AC
  Administered 2022-02-26: 1000 mL via INTRAVENOUS

## 2022-02-26 MED ORDER — KETOROLAC TROMETHAMINE 10 MG PO TABS: 10.0000 mg | ORAL_TABLET | Freq: Four times a day (QID) | ORAL | 0 refills | Status: DC | PRN

## 2022-02-26 MED ORDER — KETOROLAC TROMETHAMINE 15 MG/ML IJ SOLN
15.0000 mg | Freq: Once | INTRAMUSCULAR | Status: AC
  Administered 2022-02-26: 15 mg via INTRAVENOUS
  Filled 2022-02-26: qty 1

## 2022-02-26 MED ORDER — IOHEXOL 350 MG/ML SOLN
100.0000 mL | Freq: Once | INTRAVENOUS | Status: AC | PRN
  Administered 2022-02-26: 75 mL via INTRAVENOUS

## 2022-02-26 MED ORDER — DIPHENHYDRAMINE HCL 50 MG/ML IJ SOLN
25.0000 mg | Freq: Once | INTRAMUSCULAR | Status: AC
  Administered 2022-02-26: 25 mg via INTRAVENOUS
  Filled 2022-02-26: qty 1

## 2022-02-26 NOTE — Discharge Instructions (Addendum)
Please take tylenol/ibuprofen or toradol for pain. I recommend close follow-up with neurology for reevaluation.  Please do not hesitate to return to emergency department if worrisome signs symptoms we discussed become apparent.

## 2022-02-26 NOTE — ED Provider Notes (Cosign Needed)
[facility] EMERGENCY DEPT Provider Note   CSN: [id number] Arrival date & time: 02/26/22  0906     History  Chief Complaint  Patient presents with   Blurred Vision   Headache    Sydney Moon is a 72 y.o. female with a past medical history of hypothyroidism, COPD, GERD, hypertension, lupus presenting to the emergency room for evaluation of headache and blurry vision.  Patient reports that she started to have headache on the right side of her head and blurry visions in the right eye 3 days ago.  She was evaluated at her primary care doctor who thought it was her sinusitis.  Patient reports she took OTC medication for sinusitis with no improvement.  Patient reports she had double vision which resolved if she covered her right eye.  She described the headache as pounding, constant, located on the right side of the head and the right eye.  She reports sensitive to light and sounds.   Headache   Past Medical History:  Diagnosis Date   Allergy    COPD (chronic obstructive pulmonary disease) (HCC)    GERD (gastroesophageal reflux disease)    hernia    Hypertension    Lupus ([place])    Sleep apnea    Thyroid disease    Past Surgical History:  Procedure Laterality Date   ABDOMINAL HYSTERECTOMY     CHOLECYSTECTOMY     TUBAL LIGATION       Home Medications Prior to Admission medications   Medication Sig Start Date End Date Taking? Authorizing Provider  ketorolac (TORADOL) 10 MG tablet Take 1 tablet (10 mg total) by mouth every 6 (six) hours as needed. 02/26/22  Yes Rex Kras, PA  atenolol (TENORMIN) 50 MG tablet Take 1 tablet (50 mg total) by mouth daily. 09/27/15 10/27/15  Vira Blanco, MD  hydrochlorothiazide (MICROZIDE) 12.5 MG capsule Take 1 capsule (12.5 mg total) by mouth daily. 09/27/15 10/27/15  Vira Blanco, MD  levothyroxine (SYNTHROID, LEVOTHROID) 100 MCG tablet Take 1 tablet (100 mcg total) by mouth daily. 08/05/13   Kuneff, Renee A, DO  ramipril (ALTACE) 10 MG  capsule Take 1 capsule (10 mg total) by mouth daily. 09/27/15 10/27/15  Vira Blanco, MD      Allergies    Clams [shellfish allergy], Prednisone, and Sulfa antibiotics    Review of Systems   Review of Systems  Neurological:  Positive for headaches.    Physical Exam Updated Vital Signs BP 132/71 (BP Location: Left Arm)   Pulse 74   Temp 98.4 F (36.9 C) (Oral)   Resp 16   Ht 5\' 5"  (1.651 m)   Wt 74.4 kg   SpO2 100%   BMI 27.29 kg/m  Physical Exam  ED Results / Procedures / Treatments   Labs (all labs ordered are listed, but only abnormal results are displayed) Labs Reviewed  CBC - Abnormal; Notable for the following components:      Result Value   WBC 3.4 (*)    All other components within normal limits  BASIC METABOLIC PANEL - Abnormal; Notable for the following components:   Potassium 3.4 (*)    All other components within normal limits  TSH - Abnormal; Notable for the following components:   TSH 14.236 (*)    All other components within normal limits  RESP PANEL BY RT-PCR (RSV, FLU A&B, COVID)  RVPGX2  T3, FREE  T4, FREE    EKG None  Radiology CT VENOGRAM HEAD  Result Date: 02/26/2022 CLINICAL DATA:  Dural venous sinus thrombosis suspected. Right eye blurry vision and headache. EXAM: CT VENOGRAM HEAD TECHNIQUE: Venographic phase images of the brain were obtained following the administration of intravenous contrast. Multiplanar reformats and maximum intensity projections were generated. RADIATION DOSE REDUCTION: This exam was performed according to the departmental dose-optimization program which includes automated exposure control, adjustment of the mA and/or kV according to patient size and/or use of iterative reconstruction technique. CONTRAST:  73mL OMNIPAQUE IOHEXOL 350 MG/ML SOLN COMPARISON:  Head CT 02/16/2021 FINDINGS: CT HEAD FINDINGS Brain: There is no evidence of an acute infarct, intracranial hemorrhage, mass, midline shift, or extra-axial fluid collection.  The ventricles and sulci are within normal limits for age. Cerebral white matter hypodensities are nonspecific but compatible with mild chronic small vessel ischemic disease. Vascular: Calcified atherosclerosis at the skull base. Skull: No fracture or suspicious osseous lesion. Sinuses/Orbits: Visualized paranasal sinuses and mastoid air cells are clear. Visualized portions of the orbits are unremarkable. Other: None. CT VENOGRAM FINDINGS The superior sagittal sinus, internal cerebral veins, vein of Galen, straight sinus, transverse sinuses, sigmoid sinuses, and jugular bulbs are patent without evidence of thrombus or significant stenosis. Review of the MIP images confirms the above findings IMPRESSION: No evidence of dural venous sinus thrombosis or other acute intracranial abnormality. Electronically Signed   By: Logan Bores M.D.   On: 02/26/2022 15:28    Procedures Procedures    Medications Ordered in ED Medications  ketorolac (TORADOL) 15 MG/ML injection 15 mg (15 mg Intravenous Given 02/26/22 1310)  metoCLOPramide (REGLAN) injection 10 mg (10 mg Intravenous Given 02/26/22 1311)  diphenhydrAMINE (BENADRYL) injection 25 mg (25 mg Intravenous Given 02/26/22 1311)  sodium chloride 0.9 % bolus 1,000 mL ( Intravenous Restarted 02/26/22 1523)  iohexol (OMNIPAQUE) 350 MG/ML injection 100 mL (75 mLs Intravenous Contrast Given 02/26/22 1430)    ED Course/ Medical Decision Making/ A&P                           Medical Decision Making Amount and/or Complexity of Data Reviewed Labs: ordered. Radiology: ordered.  Risk Prescription drug management.   This patient presents to the ED for headache, double vision, this involves an extensive number of treatment options, and is a complaint that carries with a high risk of complications and morbidity.  The differential diagnosis includes cluster headache, migraine, sinusitis, tension headache, acute glaucoma, cervical artery dissection, CO poisoning, encephalitis,  encephalopathy, meningitis, mass, pseudotumor, subarachnoid hemorrhage, temporal arteritis/giant cell arteritis, traumatic intracranial hemorrhage. This is not an exhaustive list.  Comorbidities that complicate the patient evaluation See HPI  Social determinants of health NA  Additional history obtained: External records from outside source obtained and reviewed including: Chart review including previous notes, labs, imaging.  Cardiac monitoring/EKG: The patient was maintained on a cardiac monitor.  I personally reviewed and interpreted the cardiac monitor which showed an underlying rhythm of: Sinus rhythm.  Lab tests: I ordered and personally interpreted labs.  The pertinent results include: WBC 3.4. Hbg unremarkable. Platelets unremarkable. TSH 14k. Potassium 3.4. BUN, creatinine unremarkable.  Imaging studies: I ordered imaging studies. I personally reviewed, interpreted imaging and agree with the radiologist's interpretations. Findings include: CT venogram negative.   Problem list/ ED course/ Critical interventions/ Medical management: HPI: See above Vital signs within normal range and stable throughout visit. Laboratory/imaging studies significant for: See above. On physical examination, patient is afebrile and appears in no acute distress. This patient presents with a headache most consistent with  benign headache from either tension type headache vs migraine. No headache red flags. Neurologic exam without evidence of meningismus, AMS, focal neurologic findings so doubt meningitis, encephalitis, stroke. Presentation not consistent with acute intracranial bleed to include SAH (lack of risk factors, headache history). No history of trauma so doubt ICH. Patient with no signs of increased intracranial pressure or weight loss and history and physical suggest more benign headache so less likely mass effect in brain from tumor or abscess or idiopathic intracranial hypertension. I discussed  the need to get an MRI done because patient has double vision however she refused to be transferred to [facility]. I also discussed abnormal lab with TSH of 14k however patient also refused further treatment and would like an outpatient follow up with endocrinology and neurology. Pain was controlled with headache cocktail and patient discharged home in stable condition with neurology and endocrinology follow up.  I have reviewed the patient home medicines and have made adjustments as needed.  Consultations obtained: I requested consultation with Dr. Karene Fry, and discussed lab and imaging findings as well as pertinent plan.  He/she agrees with the plan.  Disposition Continued outpatient therapy. Follow-up with PCP recommended for reevaluation of symptoms. Treatment plan discussed with patient.  Pt acknowledged understanding was agreeable to the plan. Worrisome signs and symptoms were discussed with patient, and patient acknowledged understanding to return to the ED if they noticed these signs and symptoms. Patient was stable upon discharge.   This chart was dictated using voice recognition software.  Despite best efforts to proofread,  errors can occur which can change the documentation meaning.          Final Clinical Impression(s) / ED Diagnoses Final diagnoses:  Acute nonintractable headache, unspecified headache type    Rx / DC Orders ED Discharge Orders          Ordered    ketorolac (TORADOL) 10 MG tablet  Every 6 hours PRN        02/26/22 1612              Jeanelle Malling, [place] 02/26/22 2125

## 2022-02-26 NOTE — ED Triage Notes (Signed)
Pt arrives POV from home with daughter with c/o right eye blurry vision and headache since 02/22/22.  Pt was seen by PCP on 02/22/22 and was told she had a sinus infection that could be causing headache and blurry vision to right eye.  Reports congestion, runny nose, PCP did Covid swab on 02/22/22, which was negative.  Denies any chest pain, weakness, sensory changes, balance issues.  Pt BIB wheelchair, but is able to stand and remove her own jacket to obtain vital signs without any issues.

## 2022-03-01 LAB — T3, FREE: T3, Free: 2.2 pg/mL (ref 2.0–4.4)

## 2022-04-16 IMAGING — CT CT HEAD W/O CM
1 series · 16 of 30 positions shown, 20 images · non-contrast
Comparison: None.

CLINICAL DATA: Frontal headaches, left side, 3 months

EXAM:
CT HEAD WITHOUT CONTRAST
TECHNIQUE: Contiguous axial images were obtained from the base of the skull
through the vertex without intravenous contrast.

[Series 2: head w/(date) · axial · 0.46mm/px · z∈[+92,+237]mm · 16 of 33 slices shown, 20 images]
[im 2/33  brain]
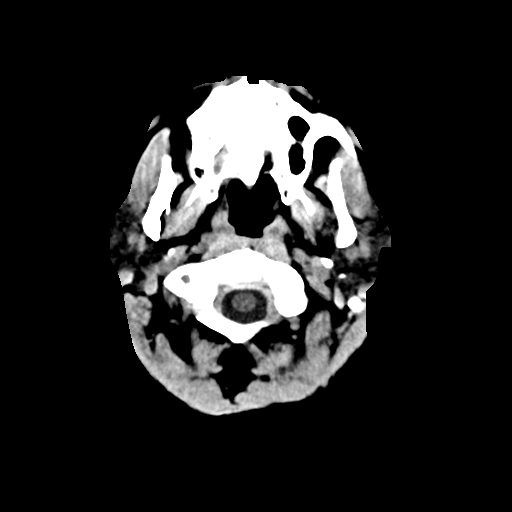
[im 2/33  bone]
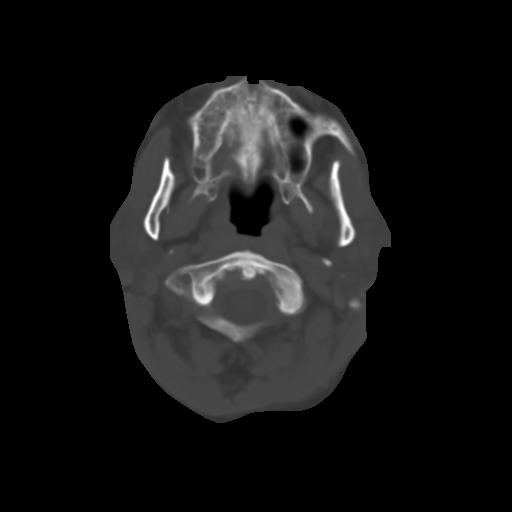
[im 4/33  brain]
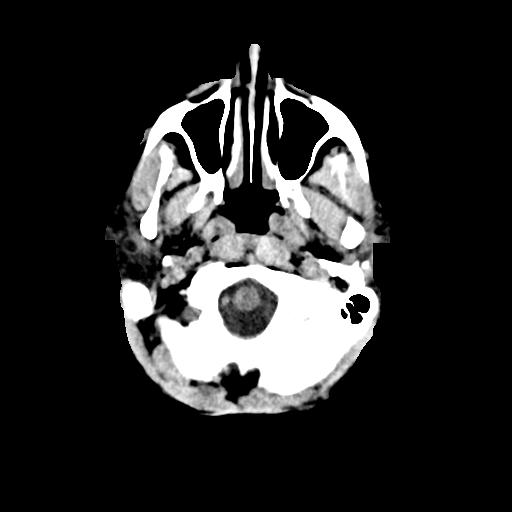
[im 6/33  brain]
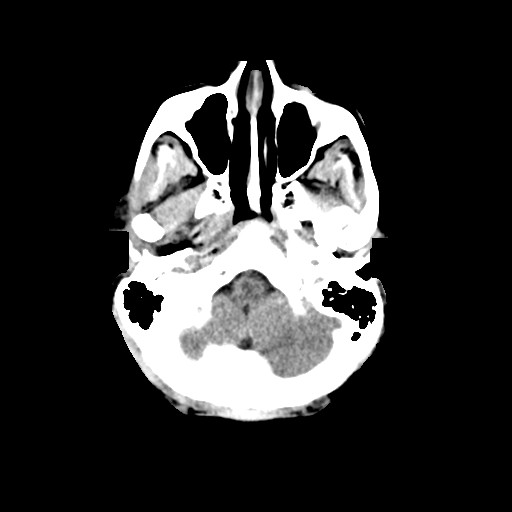
[im 8/33  brain]
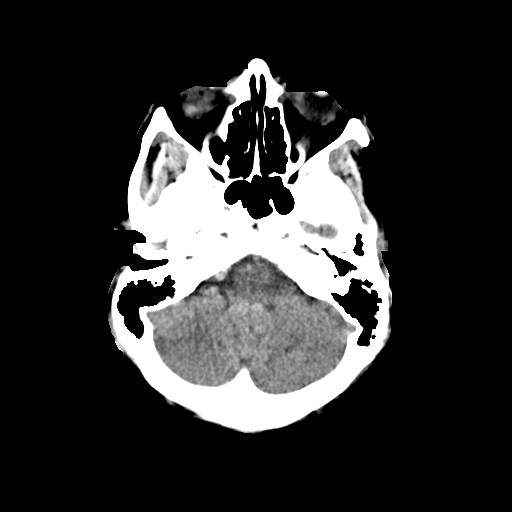
[im 9/33  brain]
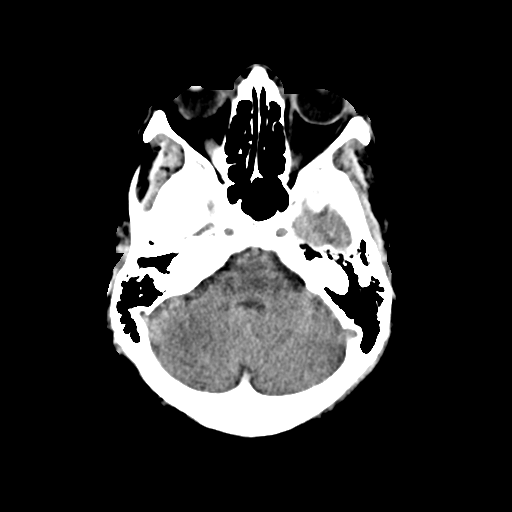
[im 9/33  bone]
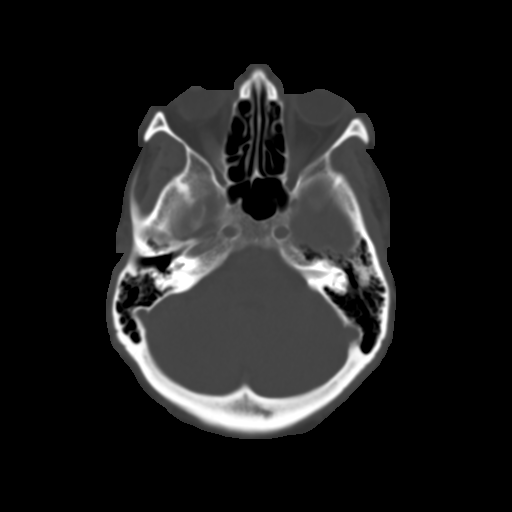
[im 12/33  brain]
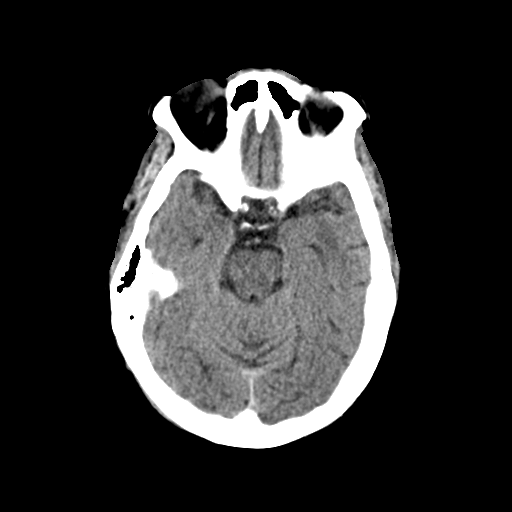
[im 14/33  brain]
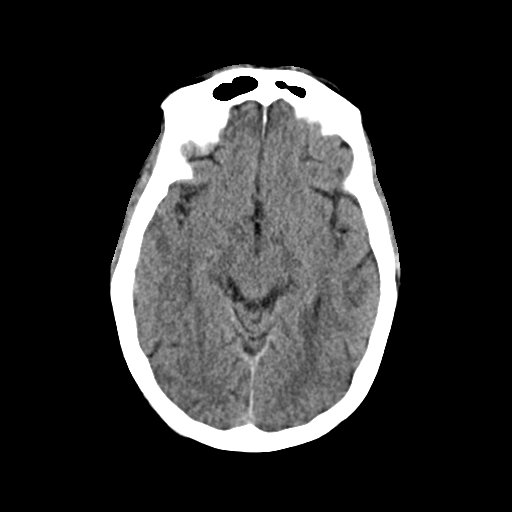
[im 16/33  brain]
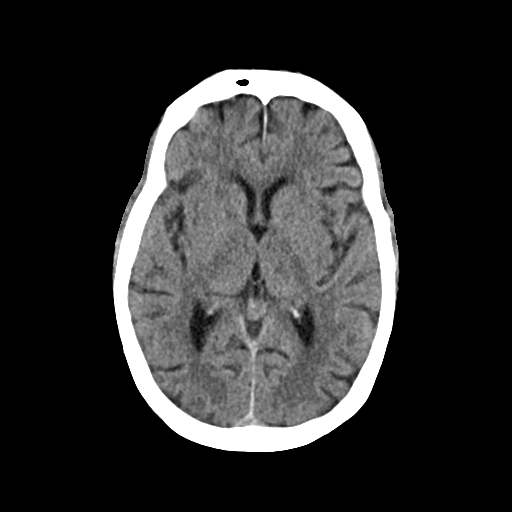
[im 17/33  brain]
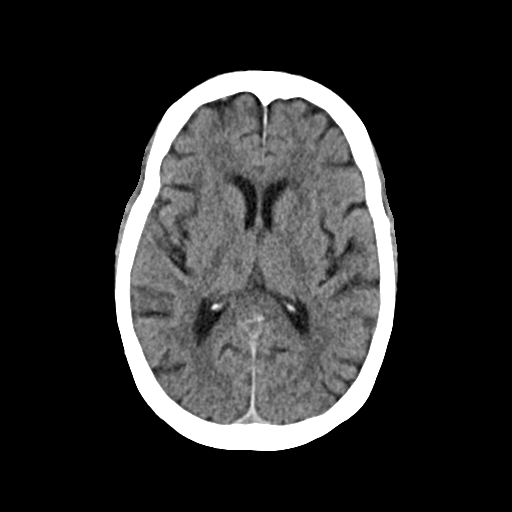
[im 17/33  bone]
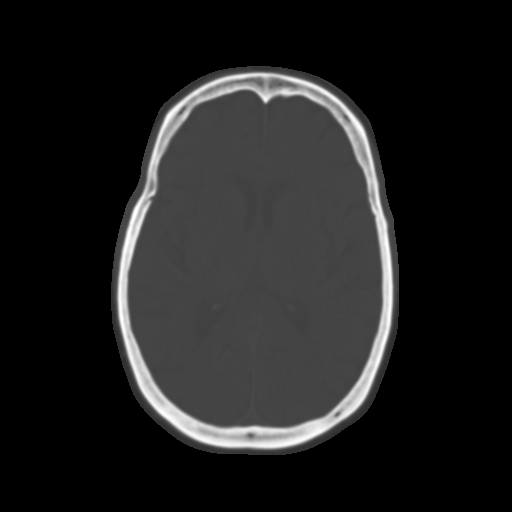
[im 19/33  brain]
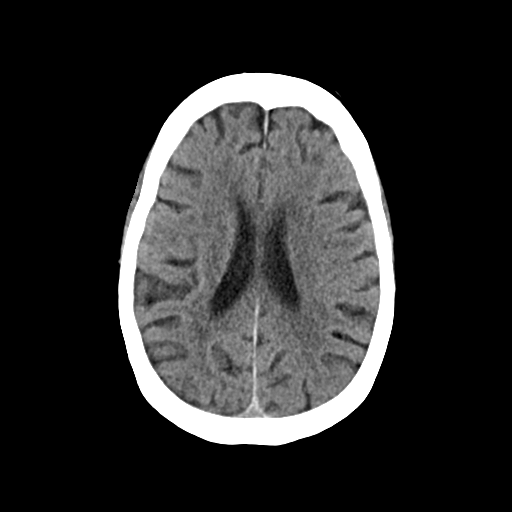
[im 21/33  brain]
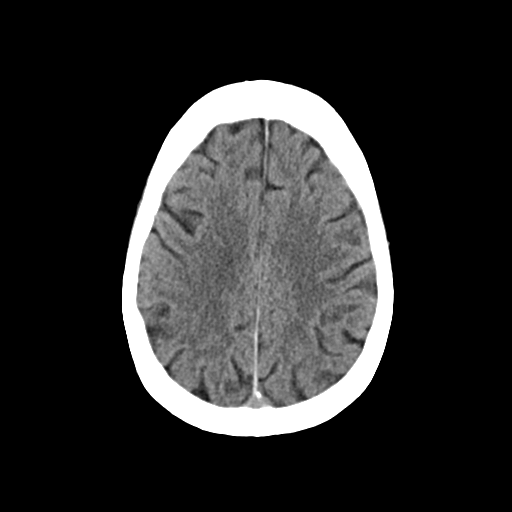
[im 24/33  brain]
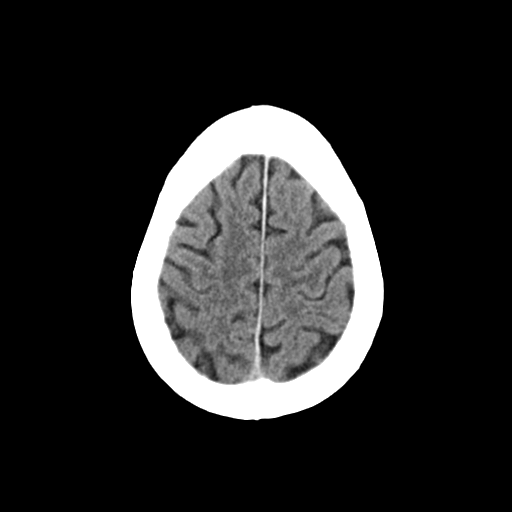
[im 25/33  brain]
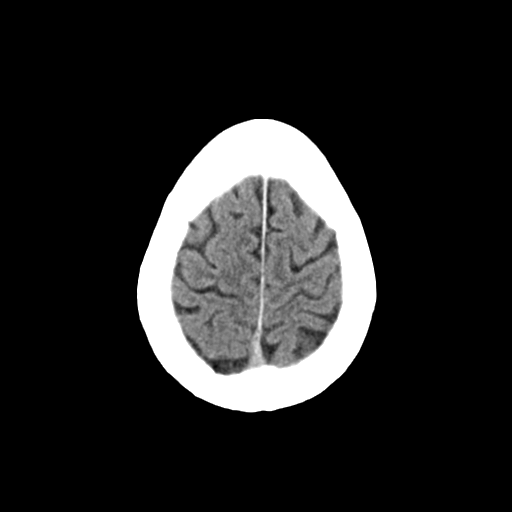
[im 25/33  bone]
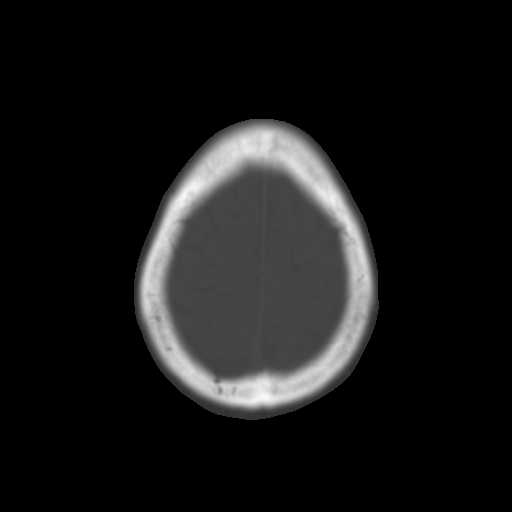
[im 27/33  brain]
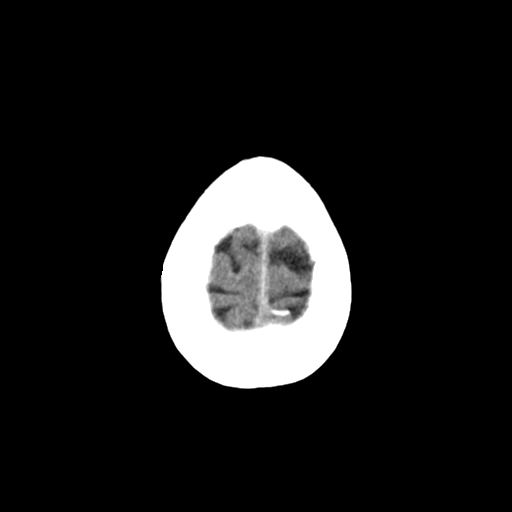
[im 29/33  brain]
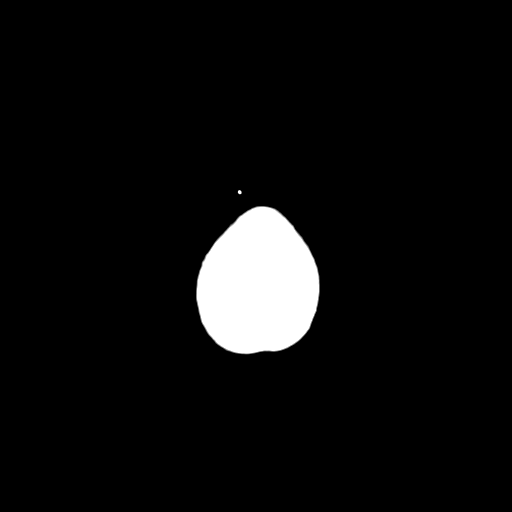
[im 31/33  brain]
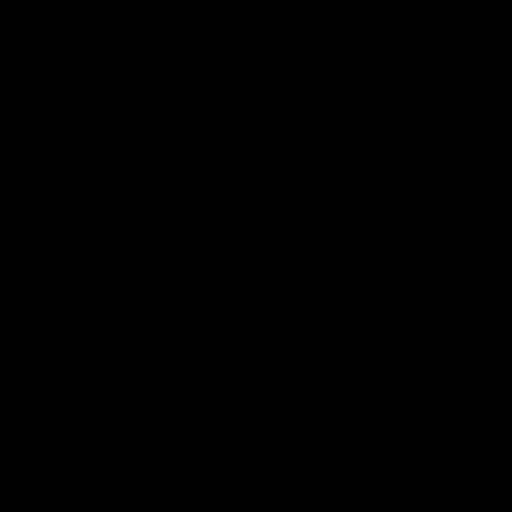

[16 of 30 positions shown; findings below may reference images not displayed]

FINDINGS: Brain: No evidence of acute infarction, hemorrhage, cerebral edema,
mass, mass effect, or midline shift. No hydrocephalus or extra-axial
fluid collection.

Vascular: No hyperdense vessel.

Skull: Normal. Negative for fracture or focal lesion.

Sinuses/Orbits: No acute finding.

Other: The mastoid air cells are well aerated.
IMPRESSION: IMPRESSION
No acute intracranial process.

## 2022-08-08 ENCOUNTER — Other Ambulatory Visit: Payer: Self-pay | Admitting: Registered Nurse

## 2022-08-08 DIAGNOSIS — D696 Thrombocytopenia, unspecified: Secondary | ICD-10-CM

## 2022-09-15 ENCOUNTER — Other Ambulatory Visit: Payer: Medicare HMO

## 2022-10-20 ENCOUNTER — Other Ambulatory Visit: Payer: Medicare HMO

## 2022-10-21 ENCOUNTER — Ambulatory Visit: Payer: Medicare HMO | Admitting: Cardiovascular Disease

## 2022-11-03 ENCOUNTER — Ambulatory Visit (HOSPITAL_BASED_OUTPATIENT_CLINIC_OR_DEPARTMENT_OTHER): Payer: Medicare HMO | Admitting: Cardiovascular Disease

## 2022-11-17 ENCOUNTER — Encounter (HOSPITAL_BASED_OUTPATIENT_CLINIC_OR_DEPARTMENT_OTHER): Payer: Self-pay

## 2023-03-17 ENCOUNTER — Ambulatory Visit
Admission: RE | Admit: 2023-03-17 | Discharge: 2023-03-17 | Disposition: A | Discharge status: Home / Self Care | Payer: Medicare HMO | Source: Ambulatory Visit | Attending: Registered Nurse

## 2023-03-17 DIAGNOSIS — D696 Thrombocytopenia, unspecified: Secondary | ICD-10-CM

## 2023-12-08 NOTE — Progress Notes (Signed)
 Please let pt know results show a slightly low potassium, please take the potassium supplement as prescribed. You can take with food to minimize stomach upset .   Looks like you are also having low blood sugar , talk to your diabetes doctor about that but make sure you are taking the glipize with a full meal.  If you continue to have low blood sugar send me or the diabetes doctor a message.   Any further questions/ concerns please let me know. Thanks

## 2023-12-12 ENCOUNTER — Ambulatory Visit (HOSPITAL_BASED_OUTPATIENT_CLINIC_OR_DEPARTMENT_OTHER): Admitting: Cardiovascular Disease

## 2023-12-12 ENCOUNTER — Encounter (HOSPITAL_BASED_OUTPATIENT_CLINIC_OR_DEPARTMENT_OTHER): Payer: Self-pay | Admitting: Cardiovascular Disease

## 2023-12-12 VITALS — BP 118/72 | HR 64 | Ht 65.0 in | Wt 153.6 lb

## 2023-12-12 DIAGNOSIS — Z01812 Encounter for preprocedural laboratory examination: Secondary | ICD-10-CM

## 2023-12-12 DIAGNOSIS — R0609 Other forms of dyspnea: Secondary | ICD-10-CM | POA: Diagnosis not present

## 2023-12-12 DIAGNOSIS — Z13220 Encounter for screening for lipoid disorders: Secondary | ICD-10-CM | POA: Diagnosis not present

## 2023-12-12 DIAGNOSIS — Z131 Encounter for screening for diabetes mellitus: Secondary | ICD-10-CM

## 2023-12-12 NOTE — Progress Notes (Signed)
 Cardiology Office Note:  .   Date:  12/12/2023  ID:  Sydney Moon, DOB 03/21/1950, MRN [id number] PCP: Siganporia, Arnaz, FNP  [facility] Providers Cardiologist:  None    History of Present Illness: .   Sydney Moon is a 73 y.o. female with hypertension, hyperlipidemia, SLE, RA, COPD, and tobacco abuse here for an evaluation of shortness of breath and family history of CAD.  Discussed the use of AI scribe software for clinical note transcription with the patient, who gave verbal consent to proceed.  History of Present Illness Sydney Moon has experienced progressive shortness of breath over the past couple of years, which worsens with exertion and when lying down. She requires a walker for longer distances and sometimes sleeps upright in a chair. No current chest pain, but she has a history of chest pain and anxiety.  She reports occasional swelling in her ankles, which she associates with dietary intake, particularly pork. She also experiences bloating and swelling in her legs, especially in the left knee.  She has a significant family history of heart disease, as her father had multiple heart attacks and a history of thyroid  cancer.  Socially, she has reduced her smoking to four to six cigarettes per day and does not consume alcohol  regularly. She occasionally cooks but primarily relies on frozen meals, with assistance from her daughter for meal preparation.  ROS:  As per HPI  Studies Reviewed: SABRA   EKG Interpretation Date/Time:  Tuesday December 12 2023 14:04:47 EDT Ventricular Rate:  64 PR Interval:  158 QRS Duration:  84 QT Interval:  408 QTC Calculation: 420 R Axis:   64  Text Interpretation: Normal sinus rhythm Possible Left atrial enlargement Nonspecific T wave abnormality When compared with ECG of 27-Sep-2015 12:53, Premature ventricular complexes are no longer Present Vent. rate has decreased BY  46 BPM ST no longer depressed in Inferior leads T wave amplitude has  decreased in Inferior leads Nonspecific T wave abnormality now evident in Anterior leads Confirmed by Raford Riggs ([id number]) on 12/12/2023 2:08:02 PM     Risk Assessment/Calculations:             Physical Exam:   VS:  BP 118/72 (BP Location: Left Arm, Patient Position: Sitting, Cuff Size: Normal)   Pulse 64   Ht 5' 5 (1.651 m)   Wt 153 lb 9.6 oz (69.7 kg)   SpO2 95%   BMI 25.56 kg/m  , BMI Body mass index is 25.56 kg/m. GENERAL:  Well appearing HEENT: Pupils equal round and reactive, fundi not visualized, oral mucosa unremarkable NECK:  No jugular venous distention, waveform within normal limits, carotid upstroke brisk and symmetric, no bruits, no thyromegaly LUNGS:  Clear to auscultation bilaterally HEART:  RRR.  PMI not displaced or sustained,S1 and S2 within normal limits, no S3, no S4, no clicks, no rubs, no murmurs ABD:  Flat, positive bowel sounds normal in frequency in pitch, no bruits, no rebound, no guarding, no midline pulsatile mass, no hepatomegaly, no splenomegaly EXT:  2 plus pulses throughout, no edema, no cyanosis no clubbing SKIN:  No rashes no nodules NEURO:  Cranial nerves II through XII grossly intact, motor grossly intact throughout PSYCH:  Cognitively intact, oriented to person place and time   ASSESSMENT AND PLAN: .    Assessment & Plan # Shortness of breath Chronic dyspnea, worsened by exertion, possible orthopnea. Cardiac etiology suspected, pulmonary causes considered. - Order cardiac CT to evaluate heart arteries and lungs. - Encourage smoking cessation  #  Essential hypertension Blood pressure well-controlled on amlodipine, losartan, and hydrochlorothiazide .  # Hypokalemia Low potassium levels noted. - Resume potassium supplementation. - Consider spironolactone  # Nicotine dependence, cigarettes Smoking 4-6 cigarettes daily, interested in reduction, previously used nicotine gum. - Prescribe lowest dose nicotine gum for smoking  cessation.        Dispo: f/u in 1 year  Signed, Annabella Scarce, MD

## 2023-12-12 NOTE — Patient Instructions (Addendum)
 Medication Instructions:  RESUME YOUR POTASSIUM DAILY   *If you need a refill on your cardiac medications before your next appointment, please call your pharmacy*  Lab Work: BMET 1 WEEK PRIOR TO CARDIAC CT   If you have labs (blood work) drawn today and your tests are completely normal, you will receive your results only by: MyChart Message (if you have MyChart) OR A paper copy in the mail If you have any lab test that is abnormal or we need to change your treatment, we will call you to review the results.  Testing/Procedures: Your physician has requested that you have cardiac CT. Cardiac computed tomography (CT) is a painless test that uses an x-ray machine to take clear, detailed pictures of your heart. For further information please visit [email or web address] Please follow instruction sheet as given.  Follow-Up: At [facility], you and your health needs are our priority.  As part of our continuing mission to provide you with exceptional heart care, our providers are all part of one team.  This team includes your primary Cardiologist (physician) and Advanced Practice Providers or APPs (Physician Assistants and Nurse Practitioners) who all work together to provide you with the care you need, when you need it.  Your next appointment:   12 month(s)  Provider:   Annabella Scarce, MD, Rosaline Bane, NP, or Reche Finder, NP    We recommend signing up for the patient portal called MyChart.  Sign up information is provided on this After Visit Summary.  MyChart is used to connect with patients for Virtual Visits (Telemedicine).  Patients are able to view lab/test results, encounter notes, upcoming appointments, etc.  Non-urgent messages can be sent to your provider as well.   To learn more about what you can do with MyChart, go to [email or web address].   Other Instructions    Your cardiac CT will be scheduled at one of the below locations:   [facility] [address] [place], [place] [address] [phone] (Severe contrast allergies only)  OR   [facility] [address] [place], [place] [address] [phone]  OR   [facility] [address] [place], [place] [address] [phone]  OR   [facility] [address]  [place], [place] [address]  OR   [facility] [address] [place], [place] [phone]  If scheduled at [facility], please arrive at the [facility] and Children's Entrance (Entrance C2) of [facility] 30 minutes prior to test start time. You can use the FREE valet parking offered at entrance C (encouraged to control the heart rate for the test)  Proceed to the [facility] Radiology Department (first floor) to check-in and test prep.  All radiology patients and guests should use entrance C2 at [facility], accessed from [facility], even though the hospital's physical address listed is [address].  If scheduled at the Heart and Vascular Tower at [address], please enter the parking lot using the [address] entrance and use the FREE valet service at the patient drop-off area. Enter the building and check-in with registration on the main floor.  If scheduled at [facility], please arrive to the Heart and Vascular Center 15 mins early for check-in and test prep.  There is spacious parking and easy access to the radiology department from the [facility] Heart and Vascular entrance. Please enter here and check-in with the desk  attendant.   If scheduled at [facility], please arrive 30 minutes early for check-in and test prep.  Please follow these instructions carefully (unless otherwise directed):  An IV will be required for this test and Nitroglycerin will be given.  Hold all erectile dysfunction medications at least 3 days (72 hrs) prior to test. (Ie  viagra, cialis, sildenafil, tadalafil, etc)   On the Night Before the Test: Be sure to Drink plenty of water. Do not consume any caffeinated/decaffeinated beverages or chocolate 12 hours prior to your test. Do not take any antihistamines 12 hours prior to your test.  On the Day of the Test: Drink plenty of water until 1 hour prior to the test. Do not eat any food 1 hour prior to test. You may take your regular medications prior to the test.  Take metoprolol (Lopressor) two hours prior to test. If you take Furosemide/Hydrochlorothiazide /Spironolactone/Chlorthalidone, please HOLD on the morning of the test. Patients who wear a continuous glucose monitor MUST remove the device prior to scanning. FEMALES- please wear underwire-free bra if available, avoid dresses & tight clothing      After the Test: Drink plenty of water. After receiving IV contrast, you may experience a mild flushed feeling. This is normal. On occasion, you may experience a mild rash up to 24 hours after the test. This is not dangerous. If this occurs, you can take Benadryl  25 mg, Zyrtec, Claritin, or Allegra and increase your fluid intake. (Patients taking Tikosyn should avoid Benadryl , and may take Zyrtec, Claritin, or Allegra) If you experience trouble breathing, this can be serious. If it is severe call 911 IMMEDIATELY. If it is mild, please call our office.  We will call to schedule your test 2-4 weeks out understanding that some insurance companies will need an authorization prior to the service being performed.   For more information and frequently asked questions, please visit our website : [email or web address]  For non-scheduling related questions, please contact the cardiac imaging nurse navigator should you have any questions/concerns: Cardiac Imaging Nurse Navigators Direct Office Dial: [phone]   For scheduling needs, including cancellations and rescheduling, please call [place],  [phone].      Cardiac CT Angiogram A cardiac CT angiogram is a procedure to look at the heart and the area around the heart. It may be done to help find the cause of chest pains or other symptoms of heart disease. During this procedure, a substance called contrast dye is injected into a vein in the arm. The contrast highlights the blood vessels in the area to be checked. A large X-ray machine (CT scanner), then takes detailed pictures of the heart and the surrounding area. The procedure is also sometimes called a coronary CT angiogram, coronary artery scanning, or CTA. A cardiac CT angiogram allows the health care provider to see how well blood is flowing to and from the heart. The provider will be able to see if there are any problems, such as: Blockage or narrowing of the arteries in the heart. Fluid around the heart. Signs of weakness or disease in the muscles, valves, and tissues of the heart. Tell a health care provider about: Any allergies you have. This is especially important if you have had a previous allergic reaction to medicines, contrast dye, or iodine. All medicines you are taking, including vitamins, herbs, eye drops, creams, and over-the-counter medicines. Any bleeding problems you have. Any surgeries you have had. Any medical conditions you have, including kidney problems or kidney failure. Whether you  are pregnant or may be pregnant. Any anxiety disorders, chronic pain, or other conditions you have. These may increase your stress or prevent you from lying still. Any history of abnormal heart rhythms or heart procedures. What are the risks? Your provider will talk with you about risks. These may include: Bleeding. Infection. Allergic reactions to medicines or dyes. Damage to other structures or organs. Kidney damage from the contrast dye. Increased risk of cancer from radiation exposure. This risk is low. Talk with your provider about: The risks and benefits of  testing. How you can receive the lowest dose of radiation. What happens before the procedure? Wear comfortable clothing and remove any jewelry, glasses, dentures, and hearing aids. Follow instructions from your provider about eating and drinking. These may include: 12 hours before the procedure Avoid caffeine. This includes tea, coffee, soda, energy drinks, and diet pills. Drink plenty of water or other fluids that do not have caffeine in them. Being well hydrated can prevent complications. 4-6 hours before the procedure Stop eating and drinking. This will reduce the risk of nausea from the contrast dye. Ask your provider about changing or stopping your regular medicines. These include: Diabetes medicines. Medicines to treat problems with erections (erectile dysfunction). If you have kidney problems, you may need to receive IV hydration before and after the test. What happens during the procedure?  Hair on your chest may need to be removed so that small sticky patches called electrodes can be placed on your chest. These will transmit information that helps to monitor your heart during the procedure. An IV will be inserted into one of your veins. You might be given a medicine to control your heart rate during the procedure. This will help to ensure that good images are obtained. You will be asked to lie on an exam table. This table will slide in and out of the CT machine during the procedure. Contrast dye will be injected into the IV. You might feel warm, or you may get a metallic taste in your mouth. You may be given medicines to relax or dilate the arteries in your heart. If you are allergic to contrast dyes or iodine you may be given medicine before the test to reduce the risk of an allergic reaction. The table that you are lying on will move into the CT machine tunnel for the scan. The person running the machine will give you instructions while the scans are being done. You may be asked  to: Keep your arms above your head. Hold your breath for short periods. Stay very still, even if the table is moving. The procedure may vary among providers and hospitals. What can I expect after the procedure? After your procedure, it is common to have: A metallic taste in your mouth from the contrast dye. A feeling of warmth. A headache from the heart medicine. Follow these instructions at home: Take over-the-counter and prescription medicines only as told by your provider. If you are told, drink enough fluid to keep your pee pale yellow. This will help to flush the contrast dye out of your body. Most people can return to their normal activities right after the procedure. Ask your provider what activities are safe for you. It is up to you to get the results of your procedure. Ask your provider, or the department that is doing the procedure, when your results will be ready. Contact a health care provider if: You have any symptoms of allergy to the contrast dye. These include: Shortness  of breath. Rash or hives. A racing heartbeat. You notice a change in your peeing (urination). This information is not intended to replace advice given to you by your health care provider. Make sure you discuss any questions you have with your health care provider. Document Revised: 09/10/2021 Document Reviewed: 09/10/2021 Elsevier Patient Education  2024 [employer].

## 2023-12-26 ENCOUNTER — Encounter (HOSPITAL_BASED_OUTPATIENT_CLINIC_OR_DEPARTMENT_OTHER): Payer: Self-pay | Admitting: Cardiovascular Disease

## 2024-01-01 LAB — BASIC METABOLIC PANEL WITH GFR
BUN/Creatinine Ratio: 15 (ref 12–28)
BUN: 18 mg/dL (ref 8–27)
CO2: 24 mmol/L (ref 20–29)
Calcium: 8.8 mg/dL (ref 8.7–10.3)
Chloride: 96 mmol/L (ref 96–106)
Creatinine, Ser: 1.2 mg/dL — ABNORMAL HIGH (ref 0.57–1.00)
Glucose: 120 mg/dL — ABNORMAL HIGH (ref 70–99)
Potassium: 3.8 mmol/L (ref 3.5–5.2)
Sodium: 135 mmol/L (ref 134–144)
eGFR: 48 mL/min/1.73 — ABNORMAL LOW (ref >59)

## 2024-01-08 ENCOUNTER — Ambulatory Visit (HOSPITAL_COMMUNITY)
Admission: RE | Admit: 2024-01-08 | Discharge: 2024-01-08 | Disposition: A | Discharge status: Home / Self Care | Source: Ambulatory Visit | Attending: Cardiovascular Disease | Admitting: Cardiovascular Disease

## 2024-01-08 DIAGNOSIS — I700 Atherosclerosis of aorta: Secondary | ICD-10-CM | POA: Diagnosis not present

## 2024-01-08 DIAGNOSIS — R0609 Other forms of dyspnea: Secondary | ICD-10-CM | POA: Insufficient documentation

## 2024-01-08 DIAGNOSIS — I2510 Atherosclerotic heart disease of native coronary artery without angina pectoris: Secondary | ICD-10-CM | POA: Diagnosis not present

## 2024-01-08 DIAGNOSIS — J9811 Atelectasis: Secondary | ICD-10-CM | POA: Insufficient documentation

## 2024-01-08 MED ORDER — DILTIAZEM HCL 25 MG/5ML IV SOLN
10.0000 mg | INTRAVENOUS | Status: AC | PRN
  Administered 2024-01-08 (×2): 10 mg via INTRAVENOUS

## 2024-01-08 MED ORDER — NITROGLYCERIN 0.4 MG SL SUBL
0.8000 mg | SUBLINGUAL_TABLET | Freq: Once | SUBLINGUAL | Status: AC
  Administered 2024-01-08: 0.8 mg via SUBLINGUAL

## 2024-01-08 MED ORDER — IOHEXOL 350 MG/ML SOLN
95.0000 mL | Freq: Once | INTRAVENOUS | Status: AC | PRN
  Administered 2024-01-08: 95 mL via INTRAVENOUS

## 2024-01-09 ENCOUNTER — Ambulatory Visit: Payer: Self-pay | Admitting: Cardiovascular Disease
